# Patient Record
Sex: Male | Born: 1945 | Race: White | Hispanic: No | Smoking: Former smoker
Health system: Southern US, Community
[De-identification: ages and names within clinical notes are randomized; demographics above are authoritative.]

## PROBLEM LIST (undated history)

## (undated) DIAGNOSIS — Q396 Congenital diverticulum of esophagus: Secondary | ICD-10-CM

---

## 2002-08-01 ENCOUNTER — Ambulatory Visit (HOSPITAL_COMMUNITY): Admission: RE | Admit: 2002-08-01 | Discharge: 2002-08-01 | Payer: Self-pay | Admitting: Neurosurgery

## 2002-08-01 ENCOUNTER — Encounter: Payer: Self-pay | Admitting: Neurosurgery

## 2002-08-09 ENCOUNTER — Encounter: Payer: Self-pay | Admitting: Neurosurgery

## 2002-08-09 ENCOUNTER — Encounter: Admission: RE | Admit: 2002-08-09 | Discharge: 2002-08-09 | Payer: Self-pay | Admitting: Neurosurgery

## 2002-09-10 ENCOUNTER — Encounter: Payer: Self-pay | Admitting: Neurosurgery

## 2002-09-13 ENCOUNTER — Inpatient Hospital Stay (HOSPITAL_COMMUNITY): Admission: RE | Admit: 2002-09-13 | Discharge: 2002-09-15 | Payer: Self-pay | Admitting: Neurosurgery

## 2002-09-15 ENCOUNTER — Encounter: Payer: Self-pay | Admitting: Neurosurgery

## 2004-08-28 ENCOUNTER — Ambulatory Visit: Payer: Self-pay

## 2006-07-19 ENCOUNTER — Ambulatory Visit: Payer: Self-pay | Admitting: Gastroenterology

## 2006-08-02 ENCOUNTER — Ambulatory Visit: Payer: Self-pay | Admitting: Gastroenterology

## 2010-12-31 ENCOUNTER — Ambulatory Visit: Payer: Self-pay | Admitting: Pain Medicine

## 2011-01-20 ENCOUNTER — Ambulatory Visit: Payer: Self-pay | Admitting: Pain Medicine

## 2012-01-31 ENCOUNTER — Other Ambulatory Visit: Payer: Self-pay | Admitting: Pain Medicine

## 2012-01-31 ENCOUNTER — Ambulatory Visit: Payer: Self-pay | Admitting: Pain Medicine

## 2012-02-08 ENCOUNTER — Ambulatory Visit: Payer: Self-pay | Admitting: Pain Medicine

## 2012-02-23 ENCOUNTER — Ambulatory Visit: Payer: Self-pay | Admitting: Pain Medicine

## 2012-06-20 ENCOUNTER — Ambulatory Visit: Payer: Self-pay | Admitting: Gastroenterology

## 2014-07-18 ENCOUNTER — Ambulatory Visit: Admit: 2014-07-18 | Disposition: A | Discharge status: Home / Self Care | Payer: Self-pay

## 2016-05-17 ENCOUNTER — Emergency Department
Admission: EM | Admit: 2016-05-17 | Discharge: 2016-05-17 | Disposition: A | Discharge status: Home / Self Care | Payer: Medicare Other | Attending: Emergency Medicine | Admitting: Emergency Medicine

## 2016-05-17 ENCOUNTER — Encounter: Payer: Self-pay | Admitting: *Deleted

## 2016-05-17 DIAGNOSIS — Z87891 Personal history of nicotine dependence: Secondary | ICD-10-CM | POA: Insufficient documentation

## 2016-05-17 DIAGNOSIS — B349 Viral infection, unspecified: Secondary | ICD-10-CM

## 2016-05-17 DIAGNOSIS — J029 Acute pharyngitis, unspecified: Secondary | ICD-10-CM | POA: Diagnosis present

## 2016-05-17 LAB — INFLUENZA PANEL BY PCR (TYPE A & B)
INFLAPCR: NEGATIVE
INFLBPCR: NEGATIVE

## 2016-05-17 LAB — POCT RAPID STREP A: Streptococcus, Group A Screen (Direct): NEGATIVE

## 2016-05-17 MED ORDER — LIDOCAINE VISCOUS 2 % MT SOLN: 5.0000 mL | Freq: Four times a day (QID) | OROMUCOSAL | 0 refills | Status: DC | PRN

## 2016-05-17 MED ORDER — KETOROLAC TROMETHAMINE 30 MG/ML IJ SOLN
30.0000 mg | Freq: Once | INTRAMUSCULAR | Status: AC
  Administered 2016-05-17: 30 mg via INTRAVENOUS
  Filled 2016-05-17: qty 1

## 2016-05-17 MED ORDER — FIRST-DUKES MOUTHWASH MT SUSP: 10.0000 mL | Freq: Four times a day (QID) | OROMUCOSAL | 0 refills | Status: DC

## 2016-05-17 NOTE — Discharge Instructions (Signed)
Advised patient to get flu shot. Advised Tylenol or ibuprofen for body aches and pain/fever.

## 2016-05-17 NOTE — ED Provider Notes (Signed)
[facility] Emergency Department Provider Note   ____________________________________________   None    (approximate)  I have reviewed the triage vital signs and the nursing notes.   HISTORY  Chief Complaint Chills and Sore Throat    HPI Oscar Sanchez is a 71 y.o. male any of fever and chills, sore throat, and body aches for 2 days. No palliative measures taken for these complaints. Patient has not taken the flu shot this season. Patient denies N/V/D. Denies nasal congestion or cough.   No past medical history on file.  There are no active problems to display for this patient.   No past surgical history on file.  Prior to Admission medications   Medication Sig Start Date End Date Taking? Authorizing Provider  tiotropium (SPIRIVA) 18 MCG inhalation capsule Place 18 mcg into inhaler and inhale daily.   Yes Historical Provider, MD  Diphenhyd-Hydrocort-Nystatin (FIRST-DUKES MOUTHWASH) SUSP Use as directed 10 mLs in the mouth or throat 4 (four) times daily. Mixed with 5 mL of viscous lidocaine for swish and swallow 05/17/16   Sable Feil, PA-C  lidocaine (XYLOCAINE) 2 % solution Use as directed 5 mLs in the mouth or throat every 6 (six) hours as needed for mouth pain. Mixed with Duke mouthwash swish and swallow. 05/17/16   Sable Feil, PA-C    Allergies Patient has no allergy information on record.  No family history on file.  Social History Social History  Substance Use Topics  . Smoking status: Former Research scientist (life sciences)  . Smokeless tobacco: Never Used  . Alcohol use No    Review of Systems Constitutional:Fever/chills and body ache. Eyes: No visual changes. ENT: Sore throates chest pain. Respiratory: Denies shortness of breath. Gastrointestinal: No abdominal pain.  No nausea, no vomiting.  No diarrhea.  No constipation. Genitourinary: Negative for dysuria. Musculoskeletal: Negative for back pain. Skin: Negative for rash. Neurological: Negative  for headaches, focal weakness or numbness.   ____________________________________________   PHYSICAL EXAM:  VITAL SIGNS: ED Triage Vitals [05/17/16 1438]  Enc Vitals Group     BP 140/75     Pulse Rate (!) 104     Resp 18     Temp 99 F (37.2 C)     Temp Source Oral     SpO2 94 %     Weight 145 lb (65.8 kg)     Height 5\' 9"  (1.753 m)     Head Circumference      Peak Flow      Pain Score      Pain Loc      Pain Edu?      Excl. in [place]?     Constitutional: Alert and oriented. Well appearing and in no acute distress. Eyes: Conjunctivae are normal. PERRL. EOMI. Head: Atraumatic. Nose: No congestion/rhinnorhea. Mouth/Throat: Mucous membranes are moist.  Oropharynx non-erythematous. Neck: No stridor.  No cervical spine tenderness to palpation. Hematological/Lymphatic/Immunilogical: No cervical lymphadenopathy. Cardiovascular: Normal rate, regular rhythm. Grossly normal heart sounds.  Good peripheral circulation. Respiratory: Normal respiratory effort.  No retractions. Lungs CTAB. Gastrointestinal: Soft and nontender. No distention. No abdominal bruits. No CVA tenderness. Musculoskeletal: No lower extremity tenderness nor edema.  No joint effusions. Neurologic:  Normal speech and language. No gross focal neurologic deficits are appreciated. No gait instability. Skin:  Skin is warm, dry and intact. No rash noted. Psychiatric: Mood and affect are normal. Speech and behavior are normal.  ____________________________________________   LABS (all labs ordered are listed, but only abnormal  results are displayed)  Labs Reviewed  INFLUENZA PANEL BY PCR (TYPE A & B)  POCT RAPID STREP A   ____________________________________________  EKG   ____________________________________________  RADIOLOGY   ____________________________________________   PROCEDURES  Procedure(s) performed: None  Procedures  Critical Care performed:  No  ____________________________________________   INITIAL IMPRESSION / ASSESSMENT AND PLAN / ED COURSE  Pertinent labs & imaging results that were available during my care of the patient were reviewed by me and considered in my medical decision making (see chart for details).  Pharyngitis and viral illness. Discussed negative rapid flu and strep tests. Advised patient cultures pending. Patient given discharge care instructions. Patient given a prescription for Duke mouthwash and viscous lidocaine. Patient advised Tylenol or ibuprofen for body aches. Patient advised to obtain a flu shot. Patient advised follow-up with [facility] clinic. Return by ER for condition worsens.      ____________________________________________   FINAL CLINICAL IMPRESSION(S) / ED DIAGNOSES  Final diagnoses:  Viral pharyngitis  Viral illness      NEW MEDICATIONS STARTED DURING THIS VISIT:  New Prescriptions   DIPHENHYD-HYDROCORT-NYSTATIN (FIRST-DUKES MOUTHWASH) SUSP    Use as directed 10 mLs in the mouth or throat 4 (four) times daily. Mixed with 5 mL of viscous lidocaine for swish and swallow   LIDOCAINE (XYLOCAINE) 2 % SOLUTION    Use as directed 5 mLs in the mouth or throat every 6 (six) hours as needed for mouth pain. Mixed with Duke mouthwash swish and swallow.     Note:  This document was prepared using Dragon voice recognition software and may include unintentional dictation errors.    Sable Feil, PA-C 05/17/16 1653    Hinda Kehr, MD 05/17/16 [phone]

## 2016-05-17 NOTE — ED Triage Notes (Signed)
Pt reports chills and fever beginning Saturday. Pt also verbalized having a sore throat. Denies NVD or cough. Pt alert and oriented in triage

## 2016-08-03 ENCOUNTER — Encounter: Payer: Medicare Other | Attending: Specialist | Admitting: Respiratory Therapy

## 2016-08-03 VITALS — Ht 68.0 in | Wt 148.1 lb

## 2016-08-03 DIAGNOSIS — J449 Chronic obstructive pulmonary disease, unspecified: Secondary | ICD-10-CM | POA: Diagnosis not present

## 2016-08-03 DIAGNOSIS — Z87891 Personal history of nicotine dependence: Secondary | ICD-10-CM | POA: Insufficient documentation

## 2016-08-03 NOTE — Progress Notes (Signed)
Pulmonary Individual Treatment Plan  Patient Details  Name: Oscar Sanchez MRN: [id number] Date of Birth: Jun 01, 1945 Referring Provider:     Pulmonary Rehab from 08/03/2016 in [facility] Cardiac and Pulmonary Rehab  Referring Provider  Raul Del      Initial Encounter Date:    Pulmonary Rehab from 08/03/2016 in [facility] Cardiac and Pulmonary Rehab  Date  08/03/16  Referring Provider  Raul Del      Visit Diagnosis: Chronic obstructive pulmonary disease, unspecified COPD type ([place])  Patient's Home Medications on Admission:  Current Outpatient Prescriptions:    Diphenhyd-Hydrocort-Nystatin (FIRST-DUKES MOUTHWASH) SUSP, Use as directed 10 mLs in the mouth or throat 4 (four) times daily. Mixed with 5 mL of viscous lidocaine for swish and swallow, Disp: 237 mL, Rfl: 0   lidocaine (XYLOCAINE) 2 % solution, Use as directed 5 mLs in the mouth or throat every 6 (six) hours as needed for mouth pain. Mixed with Duke mouthwash swish and swallow., Disp: 100 mL, Rfl: 0   tiotropium (SPIRIVA) 18 MCG inhalation capsule, Place 18 mcg into inhaler and inhale daily., Disp: , Rfl:   Past Medical History: No past medical history on file.  Tobacco Use: History  Smoking Status   Former Smoker  Smokeless Tobacco   Never Used    Labs: Recent Review Flowsheet Data    There is no flowsheet data to display.       ADL UCSD:     Pulmonary Assessment Scores    Row Name 08/03/16 1132         ADL UCSD   ADL Phase Entry     SOB Score total 13     Rest 0     Walk 0     Stairs 3     Bath 0     Dress 0     Shop 0        Pulmonary Function Assessment:     Pulmonary Function Assessment - 08/03/16 1131      Pulmonary Function Tests   RV% 50 %   DLCO% 70 %     Initial Spirometry Results   FVC% 80 %   FEV1% 64 %   FEV1/FVC Ratio 63   Comments test date 11/04/15     Post Bronchodilator Spirometry Results   FVC% 89 %   FEV1% 67 %   FEV1/FVC Ratio 60     Breath   Bilateral Breath Sounds  Clear   Shortness of Breath Yes      Exercise Target Goals: Date: 08/03/16  Exercise Program Goal: Individual exercise prescription set with THRR, safety & activity barriers. Participant demonstrates ability to understand and report RPE using BORG scale, to self-measure pulse accurately, and to acknowledge the importance of the exercise prescription.  Exercise Prescription Goal: Starting with aerobic activity 30 plus minutes a day, 3 days per week for initial exercise prescription. Provide home exercise prescription and guidelines that participant acknowledges understanding prior to discharge.  Activity Barriers & Risk Stratification:   6 Minute Walk:     6 Minute Walk    Row Name 08/03/16 1127         6 Minute Walk   Distance 1600 feet     Walk Time 6 minutes     # of Rest Breaks 0     MPH 3.03     METS 3.89     RPE 13     Perceived Dyspnea  2     VO2 Peak 13.6  Symptoms No     Resting HR 67 bpm     Resting BP 120/70     Max Ex. HR 112 bpm     Max Ex. BP 142/62       Interval HR   3 Minute HR 105     6 Minute HR 112     Interval Heart Rate? Yes       Interval Oxygen   Interval Oxygen? Yes     Baseline Oxygen Saturation % 96 %     3 Minute Oxygen Saturation % 91 %     6 Minute Oxygen Saturation % 89 %       Oxygen Initial Assessment:     Oxygen Initial Assessment - 08/03/16 1136      Home Oxygen   Home Oxygen Device None      Oxygen Re-Evaluation:   Oxygen Discharge (Final Oxygen Re-Evaluation):   Initial Exercise Prescription:     Initial Exercise Prescription - 08/03/16 1100      Date of Initial Exercise RX and Referring Provider   Date 08/03/16   Referring Provider Raul Del     Treadmill   MPH 2.8   Grade 1   Minutes 15   METs 3.53     Recumbant Bike   Level 3   RPM 60   Watts 35   Minutes 15   METs 3.5     Elliptical   Level 1   Speed 3   Minutes 15     Prescription Details   Frequency (times per week) 3   Duration  Progress to 45 minutes of aerobic exercise without signs/symptoms of physical distress     Intensity   THRR 40-80% of Max Heartrate 99-132   Ratings of Perceived Exertion 11-15     Resistance Training   Training Prescription Yes   Weight 3   Reps 10-15      Perform Capillary Blood Glucose checks as needed.  Exercise Prescription Changes:   Exercise Comments:   Exercise Goals and Review:     Exercise Goals    Row Name 08/03/16 1133             Exercise Goals   Increase Physical Activity Yes       Intervention Provide advice, education, support and counseling about physical activity/exercise needs.;Develop an individualized exercise prescription for aerobic and resistive training based on initial evaluation findings, risk stratification, comorbidities and participant's personal goals.       Expected Outcomes Achievement of increased cardiorespiratory fitness and enhanced flexibility, muscular endurance and strength shown through measurements of functional capacity and personal statement of participant.       Increase Strength and Stamina Yes       Intervention Provide advice, education, support and counseling about physical activity/exercise needs.;Develop an individualized exercise prescription for aerobic and resistive training based on initial evaluation findings, risk stratification, comorbidities and participant's personal goals.       Expected Outcomes Achievement of increased cardiorespiratory fitness and enhanced flexibility, muscular endurance and strength shown through measurements of functional capacity and personal statement of participant.          Exercise Goals Re-Evaluation :   Discharge Exercise Prescription (Final Exercise Prescription Changes):   Nutrition:  Target Goals: Understanding of nutrition guidelines, daily intake of sodium 1500mg , cholesterol 200mg , calories 30% from fat and 7% or less from saturated fats, daily to have 5 or more servings  of fruits and vegetables.  Biometrics:  Pre Biometrics - 08/03/16 1126      Pre Biometrics   Height 5\' 8"  (1.727 m)   Weight 148 lb 1.6 oz (67.2 kg)   Waist Circumference 38 inches   Hip Circumference 40.75 inches   Waist to Hip Ratio 0.93 %   BMI (Calculated) 22.6       Nutrition Therapy Plan and Nutrition Goals:   Nutrition Discharge: Rate Your Plate Scores:   Nutrition Goals Re-Evaluation:   Nutrition Goals Discharge (Final Nutrition Goals Re-Evaluation):   Psychosocial: Target Goals: Acknowledge presence or absence of significant depression and/or stress, maximize coping skills, provide positive support system. Participant is able to verbalize types and ability to use techniques and skills needed for reducing stress and depression.   Initial Review & Psychosocial Screening:     Initial Psych Review & Screening - 08/03/16 Oscar Sanchez? Yes   Comments Oscar Sanchez has good support from his church family. He is divorced and no children. 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 At age 87, he found God and has been very blessed. 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 He is looking forward to [place] for improve his shortness of breath.and increased stamina.     Barriers   Psychosocial barriers to participate in program The patient should benefit from training in stress management and relaxation.     Screening Interventions   Interventions Encouraged to exercise      Quality of Life Scores:     Quality of Life - 08/03/16 1142      Quality of Life Scores   Health/Function Pre 20.67 %   Socioeconomic Pre 20.71 %   Psych/Spiritual Pre 21 %   Family Pre 20.33 %   GLOBAL Pre 20.72 %      PHQ-9: Recent Review Flowsheet Data    Depression screen [facility] 2/9 08/03/2016   Decreased Interest 0   Down, Depressed, Hopeless 0   PHQ - 2 Score 0   Altered  sleeping 3   Tired, decreased energy 1   Change in appetite 0   Feeling bad or failure about yourself  0   Trouble concentrating 0   Moving slowly or fidgety/restless 0   Suicidal thoughts 0   PHQ-9 Score 4   Difficult doing work/chores Not difficult at all     Interpretation of Total Score  Total Score Depression Severity:  1-4 = Minimal depression, 5-9 = Mild depression, 10-14 = Moderate depression, 15-19 = Moderately severe depression, 20-27 = Severe depression   Psychosocial Evaluation and Intervention:   Psychosocial Re-Evaluation:   Psychosocial Discharge (Final Psychosocial Re-Evaluation):   Education: Education Goals: Education classes will be provided on a weekly basis, covering required topics. Participant will state understanding/return demonstration of topics presented.  Learning Barriers/Preferences:     Learning Barriers/Preferences - 08/03/16 1131      Learning Barriers/Preferences   Learning Barriers None   Learning Preferences None      Education Topics: Initial Evaluation Education: - Verbal, written and demonstration of respiratory meds, RPE/PD scales, oximetry and breathing techniques. Instruction on use of nebulizers and MDIs: cleaning and proper use, rinsing mouth with steroid doses and importance of monitoring MDI activations.   Pulmonary Rehab from 08/03/2016 in [facility] Cardiac and Pulmonary Rehab  Date  08/03/16  Educator  LB  Instruction Review Code  2-  meets goals/outcomes      General Nutrition Guidelines/Fats and Fiber: -Group instruction provided by verbal, written material, models and posters to present the general guidelines for heart healthy nutrition. Gives an explanation and review of dietary fats and fiber.   Controlling Sodium/Reading Food Labels: -Group verbal and written material supporting the discussion of sodium use in heart healthy nutrition. Review and explanation with models, verbal and written materials for utilization of  the food label.   Exercise Physiology & Risk Factors: - Group verbal and written instruction with models to review the exercise physiology of the cardiovascular system and associated critical values. Details cardiovascular disease risk factors and the goals associated with each risk factor.   Aerobic Exercise & Resistance Training: - Gives group verbal and written discussion on the health impact of inactivity. On the components of aerobic and resistive training programs and the benefits of this training and how to safely progress through these programs.   Flexibility, Balance, General Exercise Guidelines: - Provides group verbal and written instruction on the benefits of flexibility and balance training programs. Provides general exercise guidelines with specific guidelines to those with heart or lung disease. Demonstration and skill practice provided.   Stress Management: - Provides group verbal and written instruction about the health risks of elevated stress, cause of high stress, and healthy ways to reduce stress.   Depression: - Provides group verbal and written instruction on the correlation between heart/lung disease and depressed mood, treatment options, and the stigmas associated with seeking treatment.   Exercise & Equipment Safety: - Individual verbal instruction and demonstration of equipment use and safety with use of the equipment.   Infection Prevention: - Provides verbal and written material to individual with discussion of infection control including proper hand washing and proper equipment cleaning during exercise session.   Pulmonary Rehab from 08/03/2016 in [facility] Cardiac and Pulmonary Rehab  Date  08/03/16  Educator  LB  Instruction Review Code  2- meets goals/outcomes      Falls Prevention: - Provides verbal and written material to individual with discussion of falls prevention and safety.   Pulmonary Rehab from 08/03/2016 in [facility] Cardiac and Pulmonary Rehab   Date  08/03/16  Educator  LB  Instruction Review Code  2- meets goals/outcomes      Diabetes: - Individual verbal and written instruction to review signs/symptoms of diabetes, desired ranges of glucose level fasting, after meals and with exercise. Advice that pre and post exercise glucose checks will be done for 3 sessions at entry of program.   Chronic Lung Diseases: - Group verbal and written instruction to review new updates, new respiratory medications, new advancements in procedures and treatments. Provide informative websites and "800" numbers of self-education.   Lung Procedures: - Group verbal and written instruction to describe testing methods done to diagnose lung disease. Review the outcome of test results. Describe the treatment choices: Pulmonary Function Tests, ABGs and oximetry.   Energy Conservation: - Provide group verbal and written instruction for methods to conserve energy, plan and organize activities. Instruct on pacing techniques, use of adaptive equipment and posture/positioning to relieve shortness of breath.   Triggers: - Group verbal and written instruction to review types of environmental controls: home humidity, furnaces, filters, dust mite/pet prevention, HEPA vacuums. To discuss weather changes, air quality and the benefits of nasal washing.   Exacerbations: - Group verbal and written instruction to provide: warning signs, infection symptoms, calling MD promptly, preventive modes, and value of vaccinations. Review: effective airway  clearance, coughing and/or vibration techniques. Create an Sports administrator.   Oxygen: - Individual and group verbal and written instruction on oxygen therapy. Includes supplement oxygen, available portable oxygen systems, continuous and intermittent flow rates, oxygen safety, concentrators, and Medicare reimbursement for oxygen.   Respiratory Medications: - Group verbal and written instruction to review medications for lung  disease. Drug class, frequency, complications, importance of spacers, rinsing mouth after steroid MDI's, and proper cleaning methods for nebulizers.   Pulmonary Rehab from 08/03/2016 in [facility] Cardiac and Pulmonary Rehab  Date  08/03/16  Educator  LB  Instruction Review Code  2- meets goals/outcomes      AED/CPR: - Group verbal and written instruction with the use of models to demonstrate the basic use of the AED with the basic ABC's of resuscitation.   Breathing Retraining: - Provides individuals verbal and written instruction on purpose, frequency, and proper technique of diaphragmatic breathing and pursed-lipped breathing. Applies individual practice skills.   Pulmonary Rehab from 08/03/2016 in [facility] Cardiac and Pulmonary Rehab  Date  08/03/16  Educator  LB  Instruction Review Code  2- meets goals/outcomes      Anatomy and Physiology of the Lungs: - Group verbal and written instruction with the use of models to provide basic lung anatomy and physiology related to function, structure and complications of lung disease.   Heart Failure: - Group verbal and written instruction on the basics of heart failure: signs/symptoms, treatments, explanation of ejection fraction, enlarged heart and cardiomyopathy.   Sleep Apnea: - Individual verbal and written instruction to review Obstructive Sleep Apnea. Review of risk factors, methods for diagnosing and types of masks and machines for OSA.   Anxiety: - Provides group, verbal and written instruction on the correlation between heart/lung disease and anxiety, treatment options, and management of anxiety.   Relaxation: - Provides group, verbal and written instruction about the benefits of relaxation for patients with heart/lung disease. Also provides patients with examples of relaxation techniques.   Knowledge Questionnaire Score:     Knowledge Questionnaire Score - 08/03/16 1131      Knowledge Questionnaire Score   Pre Score 6/10        Core Components/Risk Factors/Patient Goals at Admission:     Personal Goals and Risk Factors at Admission - 08/03/16 1135      Core Components/Risk Factors/Patient Goals on Admission   Improve shortness of breath with ADL's Yes   Intervention Provide education, individualized exercise plan and daily activity instruction to help decrease symptoms of SOB with activities of daily living.   Expected Outcomes Short Term: Achieves a reduction of symptoms when performing activities of daily living.   Develop more efficient breathing techniques such as purse lipped breathing and diaphragmatic breathing; and practicing self-pacing with activity Yes   Intervention Provide education, demonstration and support about specific breathing techniuqes utilized for more efficient breathing. Include techniques such as pursed lipped breathing, diaphragmatic breathing and self-pacing activity.   Expected Outcomes Short Term: Participant will be able to demonstrate and use breathing techniques as needed throughout daily activities.   Increase knowledge of respiratory medications and ability to use respiratory devices properly  Yes  Spiriva, rarely uses Proventil   Intervention Provide education and demonstration as needed of appropriate use of medications, inhalers, and oxygen therapy.   Expected Outcomes Short Term: Achieves understanding of medications use. Understands that oxygen is a medication prescribed by physician. Demonstrates appropriate use of inhaler and oxygen therapy.      Core Components/Risk Factors/Patient Goals  Review:    Core Components/Risk Factors/Patient Goals at Discharge (Final Review):    ITP Comments:     ITP Comments    Row Name 08/03/16 1135           ITP Comments Face to face meeting with Dr Emily Filbert, [place] Director today.          Comments: Oscar Sanchez plans to start [place] on 08/09/16 and attend 3 days/week.

## 2016-08-03 NOTE — Patient Instructions (Signed)
Patient Instructions  Patient Details  Name: Oscar Sanchez MRN: [id number] Date of Birth: 02/21/1946 Referring Provider:  Erby Pian, MD  Below are the personal goals you chose as well as exercise and nutrition goals. Our goal is to help you keep on track towards obtaining and maintaining your goals. We will be discussing your progress on these goals with you throughout the program.  Initial Exercise Prescription:     Initial Exercise Prescription - 08/03/16 1100      Date of Initial Exercise RX and Referring Provider   Date 08/03/16   Referring Provider Raul Del     Treadmill   MPH 2.8   Grade 1   Minutes 15   METs 3.53     Recumbant Bike   Level 3   RPM 60   Watts 35   Minutes 15   METs 3.5     Elliptical   Level 1   Speed 3   Minutes 15     Prescription Details   Frequency (times per week) 3   Duration Progress to 45 minutes of aerobic exercise without signs/symptoms of physical distress     Intensity   THRR 40-80% of Max Heartrate 99-132   Ratings of Perceived Exertion 11-15     Resistance Training   Training Prescription Yes   Weight 3   Reps 10-15      Exercise Goals: Frequency: Be able to perform aerobic exercise three times per week working toward 3-5 days per week.  Intensity: Work with a perceived exertion of 11 (fairly light) - 15 (hard) as tolerated. Follow your new exercise prescription and watch for changes in prescription as you progress with the program. Changes will be reviewed with you when they are made.  Duration: You should be able to do 30 minutes of continuous aerobic exercise in addition to a 5 minute warm-up and a 5 minute cool-down routine.  Nutrition Goals: Your personal nutrition goals will be established when you do your nutrition analysis with the dietician.  The following are nutrition guidelines to follow: Cholesterol < 200mg /day Sodium < 1500mg /day Fiber: Men over 50 yrs - 30 grams per day  Personal Goals:      Personal Goals and Risk Factors at Admission - 08/03/16 1135      Core Components/Risk Factors/Patient Goals on Admission   Improve shortness of breath with ADL's Yes   Intervention Provide education, individualized exercise plan and daily activity instruction to help decrease symptoms of SOB with activities of daily living.   Expected Outcomes Short Term: Achieves a reduction of symptoms when performing activities of daily living.   Develop more efficient breathing techniques such as purse lipped breathing and diaphragmatic breathing; and practicing self-pacing with activity Yes   Intervention Provide education, demonstration and support about specific breathing techniuqes utilized for more efficient breathing. Include techniques such as pursed lipped breathing, diaphragmatic breathing and self-pacing activity.   Expected Outcomes Short Term: Participant will be able to demonstrate and use breathing techniques as needed throughout daily activities.   Increase knowledge of respiratory medications and ability to use respiratory devices properly  Yes  Spiriva, rarely uses Proventil   Intervention Provide education and demonstration as needed of appropriate use of medications, inhalers, and oxygen therapy.   Expected Outcomes Short Term: Achieves understanding of medications use. Understands that oxygen is a medication prescribed by physician. Demonstrates appropriate use of inhaler and oxygen therapy.      Tobacco Use Initial Evaluation: History  Smoking Status  Former Smoker  Smokeless Tobacco   Never Used    Copy of goals given to participant.

## 2016-08-09 ENCOUNTER — Encounter: Payer: Medicare Other | Admitting: Respiratory Therapy

## 2016-08-09 DIAGNOSIS — J449 Chronic obstructive pulmonary disease, unspecified: Secondary | ICD-10-CM | POA: Diagnosis not present

## 2016-08-09 NOTE — Progress Notes (Signed)
Daily Session Note  Patient Details  Name: Torri Langston MRN: [id number] Date of Birth: [birth year] Referring Provider:     Pulmonary Rehab from 08/03/2016 in [facility]  Referring Provider  Raul Del      Encounter Date: 08/09/2016  Check In:     Session Check In - 08/09/16 1348      Check-In   Location [facility]-Cardiac & Pulmonary [facility]hab   Staff Present Nyoka Cowden, RN, BSN, [place], BS, RRT, Respiratory Therapist;Kelly Amedeo Plenty, BS, ACSM CEP, Exercise Physiologist   Supervising physician immediately available to respond to emergencies [facility] immediately available ER MD   Physician(s) Burlene Arnt and Jimmye Norman   Medication changes reported     No   Fall or balance concerns reported    No   Warm-up and Cool-down Performed on first and last piece of equipment   Resistance Training Performed Yes   VAD Patient? No     Pain Assessment   Currently in Pain? No/denies   Multiple Pain Sites No         History  Smoking Status   Former Smoker  Smokeless Tobacco   Never Used    Goals Met:  Proper associated with RPD/PD & O2 Sat Exercise tolerated well Personal goals reviewed Queuing for purse lip breathing No report of cardiac concerns or symptoms Strength training completed today  Goals Unmet:  Not Applicable  Comments: First full day of exercise!  Patient was oriented to gym and equipment including functions, settings, policies, and procedures.  Patient's individual exercise prescription and treatment plan were reviewed.  All starting workloads were established based on the results of the 6 minute walk test done at initial orientation visit.  The plan for exercise progression was also introduced and progression will be customized based on patient's performance and goals.   Dr. Emily Filbert is Medical Director for [facility] and [facility].

## 2016-08-09 NOTE — Progress Notes (Signed)
Pulmonary Individual Treatment Plan  Patient Details  Name: Oscar Sanchez MRN: [id number] Date of Birth: 1945/05/15 Referring Provider:     Pulmonary Rehab from 08/03/2016 in [facility] Cardiac and Pulmonary Rehab  Referring Provider  Raul Del      Initial Encounter Date:    Pulmonary Rehab from 08/03/2016 in [facility] Cardiac and Pulmonary Rehab  Date  08/03/16  Referring Provider  Raul Del      Visit Diagnosis: Chronic obstructive pulmonary disease, unspecified COPD type ([place])  Patient's Home Medications on Admission:  Current Outpatient Prescriptions:    Diphenhyd-Hydrocort-Nystatin (FIRST-DUKES MOUTHWASH) SUSP, Use as directed 10 mLs in the mouth or throat 4 (four) times daily. Mixed with 5 mL of viscous lidocaine for swish and swallow, Disp: 237 mL, Rfl: 0   lidocaine (XYLOCAINE) 2 % solution, Use as directed 5 mLs in the mouth or throat every 6 (six) hours as needed for mouth pain. Mixed with [facility] mouthwash swish and swallow., Disp: 100 mL, Rfl: 0   tiotropium (SPIRIVA) 18 MCG inhalation capsule, Place 18 mcg into inhaler and inhale daily., Disp: , Rfl:   Past Medical History: No past medical history on file.  Tobacco Use: History  Smoking Status   Former Smoker  Smokeless Tobacco   Never Used    Labs: Recent Review Flowsheet Data    There is no flowsheet data to display.       ADL UCSD:     Pulmonary Assessment Scores    Row Name 08/03/16 1132         ADL UCSD   ADL Phase Entry     SOB Score total 13     Rest 0     Walk 0     Stairs 3     Bath 0     Dress 0     Shop 0        Pulmonary Function Assessment:     Pulmonary Function Assessment - 08/03/16 1131      Pulmonary Function Tests   RV% 50 %   DLCO% 70 %     Initial Spirometry Results   FVC% 80 %   FEV1% 64 %   FEV1/FVC Ratio 63   Comments test date 11/04/15     Post Bronchodilator Spirometry Results   FVC% 89 %   FEV1% 67 %   FEV1/FVC Ratio 60     Breath   Bilateral Breath Sounds  Clear   Shortness of Breath Yes      Exercise Target Goals:    Exercise Program Goal: Individual exercise prescription set with THRR, safety & activity barriers. Participant demonstrates ability to understand and report RPE using BORG scale, to self-measure pulse accurately, and to acknowledge the importance of the exercise prescription.  Exercise Prescription Goal: Starting with aerobic activity 30 plus minutes a day, 3 days per week for initial exercise prescription. Provide home exercise prescription and guidelines that participant acknowledges understanding prior to discharge.  Activity Barriers & Risk Stratification:   6 Minute Walk:     6 Minute Walk    Row Name 08/03/16 1127         6 Minute Walk   Distance 1600 feet     Walk Time 6 minutes     # of Rest Breaks 0     MPH 3.03     METS 3.89     RPE 13     Perceived Dyspnea  2     VO2 Peak 13.6  Symptoms No     Resting HR 67 bpm     Resting BP 120/70     Max Ex. HR 112 bpm     Max Ex. BP 142/62       Interval HR   3 Minute HR 105     6 Minute HR 112     Interval Heart Rate? Yes       Interval Oxygen   Interval Oxygen? Yes     Baseline Oxygen Saturation % 96 %     3 Minute Oxygen Saturation % 91 %     6 Minute Oxygen Saturation % 89 %       Oxygen Initial Assessment:     Oxygen Initial Assessment - 08/03/16 1136      Home Oxygen   Home Oxygen Device None      Oxygen Re-Evaluation:   Oxygen Discharge (Final Oxygen Re-Evaluation):   Initial Exercise Prescription:     Initial Exercise Prescription - 08/03/16 1100      Date of Initial Exercise RX and Referring Provider   Date 08/03/16   Referring Provider Raul Del     Treadmill   MPH 2.8   Grade 1   Minutes 15   METs 3.53     Recumbant Bike   Level 3   RPM 60   Watts 35   Minutes 15   METs 3.5     Elliptical   Level 1   Speed 3   Minutes 15     Prescription Details   Frequency (times per week) 3   Duration Progress to  45 minutes of aerobic exercise without signs/symptoms of physical distress     Intensity   THRR 40-80% of Max Heartrate 99-132   Ratings of Perceived Exertion 11-15     Resistance Training   Training Prescription Yes   Weight 3   Reps 10-15      Perform Capillary Blood Glucose checks as needed.  Exercise Prescription Changes:     Exercise Prescription Changes    Row Name 08/09/16 1300             Response to Exercise   Blood Pressure (Admit) 110/62       Blood Pressure (Exercise) 150/66       Blood Pressure (Exit) 110/68       Heart Rate (Admit) 85 bpm       Heart Rate (Exercise) 133 bpm       Heart Rate (Exit) 102 bpm       Oxygen Saturation (Admit) 97 %       Oxygen Saturation (Exercise) 92 %       Oxygen Saturation (Exit) 92 %       Rating of Perceived Exertion (Exercise) 13       Perceived Dyspnea (Exercise) 2       Duration Progress to 45 minutes of aerobic exercise without signs/symptoms of physical distress       Intensity THRR unchanged         Progression   Progression Continue to progress workloads to maintain intensity without signs/symptoms of physical distress.         Resistance Training   Training Prescription Yes       Weight 3       Reps 10-15         Treadmill   MPH 2.8       Grade 1       Minutes 15  Recumbant Bike   Level 3       RPM 80       Minutes 10         Elliptical   Level 1       Minutes 5          Exercise Comments:   Exercise Goals and Review:     Exercise Goals    Row Name 08/03/16 1133             Exercise Goals   Increase Physical Activity Yes       Intervention Provide advice, education, support and counseling about physical activity/exercise needs.;Develop an individualized exercise prescription for aerobic and resistive training based on initial evaluation findings, risk stratification, comorbidities and participant's personal goals.       Expected Outcomes Achievement of increased  cardiorespiratory fitness and enhanced flexibility, muscular endurance and strength shown through measurements of functional capacity and personal statement of participant.       Increase Strength and Stamina Yes       Intervention Provide advice, education, support and counseling about physical activity/exercise needs.;Develop an individualized exercise prescription for aerobic and resistive training based on initial evaluation findings, risk stratification, comorbidities and participant's personal goals.       Expected Outcomes Achievement of increased cardiorespiratory fitness and enhanced flexibility, muscular endurance and strength shown through measurements of functional capacity and personal statement of participant.          Exercise Goals Re-Evaluation :   Discharge Exercise Prescription (Final Exercise Prescription Changes):     Exercise Prescription Changes - 08/09/16 1300      Response to Exercise   Blood Pressure (Admit) 110/62   Blood Pressure (Exercise) 150/66   Blood Pressure (Exit) 110/68   Heart Rate (Admit) 85 bpm   Heart Rate (Exercise) 133 bpm   Heart Rate (Exit) 102 bpm   Oxygen Saturation (Admit) 97 %   Oxygen Saturation (Exercise) 92 %   Oxygen Saturation (Exit) 92 %   Rating of Perceived Exertion (Exercise) 13   Perceived Dyspnea (Exercise) 2   Duration Progress to 45 minutes of aerobic exercise without signs/symptoms of physical distress   Intensity THRR unchanged     Progression   Progression Continue to progress workloads to maintain intensity without signs/symptoms of physical distress.     Resistance Training   Training Prescription Yes   Weight 3   Reps 10-15     Treadmill   MPH 2.8   Grade 1   Minutes 15     Recumbant Bike   Level 3   RPM 80   Minutes 10     Elliptical   Level 1   Minutes 5      Nutrition:  Target Goals: Understanding of nutrition guidelines, daily intake of sodium '1500mg'$ , cholesterol '200mg'$ , calories 30% from  fat and 7% or less from saturated fats, daily to have 5 or more servings of fruits and vegetables.  Biometrics:     Pre Biometrics - 08/03/16 1126      Pre Biometrics   Height '5\' 8"'$  (1.727 m)   Weight 148 lb 1.6 oz (67.2 kg)   Waist Circumference 38 inches   Hip Circumference 40.75 inches   Waist to Hip Ratio 0.93 %   BMI (Calculated) 22.6       Nutrition Therapy Plan and Nutrition Goals:   Nutrition Discharge: Rate Your Plate Scores:   Nutrition Goals Re-Evaluation:   Nutrition Goals Discharge (Final Nutrition Goals Re-Evaluation):  Psychosocial: Target Goals: Acknowledge presence or absence of significant depression and/or stress, maximize coping skills, provide positive support system. Participant is able to verbalize types and ability to use techniques and skills needed for reducing stress and depression.   Initial Review & Psychosocial Screening:     Initial Psych Review & Screening - 08/03/16 [place]? Yes   Comments Mr Manson has good support from his church family. He is divorced and no children. 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 At age 41, he found God and has been very blessed. 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 He is looking forward to [place] for improve his shortness of breath.and increased stamina.     Barriers   Psychosocial barriers to participate in program The patient should benefit from training in stress management and relaxation.     Screening Interventions   Interventions Encouraged to exercise      Quality of Life Scores:     Quality of Life - 08/03/16 1142      Quality of Life Scores   Health/Function Pre 20.67 %   Socioeconomic Pre 20.71 %   Psych/Spiritual Pre 21 %   Family Pre 20.33 %   GLOBAL Pre 20.72 %      PHQ-9: Recent Review Flowsheet Data    Depression screen [facility] 2/9 08/03/2016   Decreased  Interest 0   Down, Depressed, Hopeless 0   PHQ - 2 Score 0   Altered sleeping 3   Tired, decreased energy 1   Change in appetite 0   Feeling bad or failure about yourself  0   Trouble concentrating 0   Moving slowly or fidgety/restless 0   Suicidal thoughts 0   PHQ-9 Score 4   Difficult doing work/chores Not difficult at all     Interpretation of Total Score  Total Score Depression Severity:  1-4 = Minimal depression, 5-9 = Mild depression, 10-14 = Moderate depression, 15-19 = Moderately severe depression, 20-27 = Severe depression   Psychosocial Evaluation and Intervention:     Psychosocial Evaluation - 08/09/16 1242      Psychosocial Evaluation & Interventions   Interventions Encouraged to exercise with the program and follow exercise prescription   Comments Counselor met with mr. Reymundo Poll Konrad Dolores) today for initial psychosocial evaluation.  He is a 71 year old who has been diagnosed with COPD.  He lives alone but has several sisters who live locally and he is actively involved in his local church.  Tommy reports he does not sleep well but he admits to "drinking a lot of coffee."  Counselor encouraged him to switch to decaf after noon and he agreed to try this.  He denies a history of depression or anxiety or current symptoms.  He states he is typically in a positive mood and he has minimal stress in his life - and relies on his faith for any stress that arises.  He has goals to increase his education about his disease and ways to improve his health.  He desires to increase his stamina and strength while in this program.  Staff will be following with him throughout the course of this program.    Expected Outcomes Tommy will exercise consistently to achieve his stated goals.  He will benefit from the educational components as well.     Continue Psychosocial Services  Follow up required by staff      Psychosocial Re-Evaluation:   Psychosocial Discharge (Final Psychosocial  Re-Evaluation):   Education: Education Goals: Education classes will be provided on a weekly basis, covering required topics. Participant will state understanding/return demonstration of topics presented.  Learning Barriers/Preferences:     Learning Barriers/Preferences - 08/03/16 1131      Learning Barriers/Preferences   Learning Barriers None   Learning Preferences None      Education Topics: Initial Evaluation Education: - Verbal, written and demonstration of respiratory meds, RPE/PD scales, oximetry and breathing techniques. Instruction on use of nebulizers and MDIs: cleaning and proper use, rinsing mouth with steroid doses and importance of monitoring MDI activations.   Pulmonary Rehab from 08/09/2016 in [facility] Cardiac and Pulmonary Rehab  Date  08/03/16  Educator  LB  Instruction Review Code  2- meets goals/outcomes      General Nutrition Guidelines/Fats and Fiber: -Group instruction provided by verbal, written material, models and posters to present the general guidelines for heart healthy nutrition. Gives an explanation and review of dietary fats and fiber.   Controlling Sodium/Reading Food Labels: -Group verbal and written material supporting the discussion of sodium use in heart healthy nutrition. Review and explanation with models, verbal and written materials for utilization of the food label.   Pulmonary Rehab from 08/09/2016 in [facility] Cardiac and Pulmonary Rehab  Date  08/09/16  Educator  CR  Instruction Review Code  2- meets goals/outcomes      Exercise Physiology & Risk Factors: - Group verbal and written instruction with models to review the exercise physiology of the cardiovascular system and associated critical values. Details cardiovascular disease risk factors and the goals associated with each risk factor.   Aerobic Exercise & Resistance Training: - Gives group verbal and written discussion on the health impact of inactivity. On the components of aerobic and  resistive training programs and the benefits of this training and how to safely progress through these programs.   Flexibility, Balance, General Exercise Guidelines: - Provides group verbal and written instruction on the benefits of flexibility and balance training programs. Provides general exercise guidelines with specific guidelines to those with heart or lung disease. Demonstration and skill practice provided.   Stress Management: - Provides group verbal and written instruction about the health risks of elevated stress, cause of high stress, and healthy ways to reduce stress.   Depression: - Provides group verbal and written instruction on the correlation between heart/lung disease and depressed mood, treatment options, and the stigmas associated with seeking treatment.   Exercise & Equipment Safety: - Individual verbal instruction and demonstration of equipment use and safety with use of the equipment.   Pulmonary Rehab from 08/09/2016 in [facility] Cardiac and Pulmonary Rehab  Date  08/09/16  Educator  LB  Instruction Review Code  2- meets goals/outcomes      Infection Prevention: - Provides verbal and written material to individual with discussion of infection control including proper hand washing and proper equipment cleaning during exercise session.   Pulmonary Rehab from 08/09/2016 in [facility] Cardiac and Pulmonary Rehab  Date  08/03/16  Educator  LB  Instruction Review Code  2- meets goals/outcomes      Falls Prevention: - Provides verbal and written material to individual with discussion of falls prevention and safety.   Pulmonary Rehab from 08/09/2016 in [facility] Cardiac and Pulmonary Rehab  Date  08/03/16  Educator  LB  Instruction Review Code  2- meets goals/outcomes  Diabetes: - Individual verbal and written instruction to review signs/symptoms of diabetes, desired ranges of glucose level fasting, after meals and with exercise. Advice that pre and post exercise glucose  checks will be done for 3 sessions at entry of program.   Chronic Lung Diseases: - Group verbal and written instruction to review new updates, new respiratory medications, new advancements in procedures and treatments. Provide informative websites and "800" numbers of self-education.   Lung Procedures: - Group verbal and written instruction to describe testing methods done to diagnose lung disease. Review the outcome of test results. Describe the treatment choices: Pulmonary Function Tests, ABGs and oximetry.   Energy Conservation: - Provide group verbal and written instruction for methods to conserve energy, plan and organize activities. Instruct on pacing techniques, use of adaptive equipment and posture/positioning to relieve shortness of breath.   Triggers: - Group verbal and written instruction to review types of environmental controls: home humidity, furnaces, filters, dust mite/pet prevention, HEPA vacuums. To discuss weather changes, air quality and the benefits of nasal washing.   Exacerbations: - Group verbal and written instruction to provide: warning signs, infection symptoms, calling MD promptly, preventive modes, and value of vaccinations. Review: effective airway clearance, coughing and/or vibration techniques. Create an Sports administrator.   Oxygen: - Individual and group verbal and written instruction on oxygen therapy. Includes supplement oxygen, available portable oxygen systems, continuous and intermittent flow rates, oxygen safety, concentrators, and Medicare reimbursement for oxygen.   Respiratory Medications: - Group verbal and written instruction to review medications for lung disease. Drug class, frequency, complications, importance of spacers, rinsing mouth after steroid MDI's, and proper cleaning methods for nebulizers.   Pulmonary Rehab from 08/09/2016 in [facility] Cardiac and Pulmonary Rehab  Date  08/03/16  Educator  LB  Instruction Review Code  2- meets goals/outcomes       AED/CPR: - Group verbal and written instruction with the use of models to demonstrate the basic use of the AED with the basic ABC's of resuscitation.   Breathing Retraining: - Provides individuals verbal and written instruction on purpose, frequency, and proper technique of diaphragmatic breathing and pursed-lipped breathing. Applies individual practice skills.   Pulmonary Rehab from 08/09/2016 in [facility] Cardiac and Pulmonary Rehab  Date  08/03/16  Educator  LB  Instruction Review Code  2- meets goals/outcomes      Anatomy and Physiology of the Lungs: - Group verbal and written instruction with the use of models to provide basic lung anatomy and physiology related to function, structure and complications of lung disease.   Heart Failure: - Group verbal and written instruction on the basics of heart failure: signs/symptoms, treatments, explanation of ejection fraction, enlarged heart and cardiomyopathy.   Sleep Apnea: - Individual verbal and written instruction to review Obstructive Sleep Apnea. Review of risk factors, methods for diagnosing and types of masks and machines for OSA.   Anxiety: - Provides group, verbal and written instruction on the correlation between heart/lung disease and anxiety, treatment options, and management of anxiety.   Relaxation: - Provides group, verbal and written instruction about the benefits of relaxation for patients with heart/lung disease. Also provides patients with examples of relaxation techniques.   Knowledge Questionnaire Score:     Knowledge Questionnaire Score - 08/03/16 1131      Knowledge Questionnaire Score   Pre Score 6/10       Core Components/Risk Factors/Patient Goals at Admission:     Personal Goals and Risk Factors at Admission - 08/03/16 1135  Core Components/Risk Factors/Patient Goals on Admission   Improve shortness of breath with ADL's Yes   Intervention Provide education, individualized exercise plan  and daily activity instruction to help decrease symptoms of SOB with activities of daily living.   Expected Outcomes Short Term: Achieves a reduction of symptoms when performing activities of daily living.   Develop more efficient breathing techniques such as purse lipped breathing and diaphragmatic breathing; and practicing self-pacing with activity Yes   Intervention Provide education, demonstration and support about specific breathing techniuqes utilized for more efficient breathing. Include techniques such as pursed lipped breathing, diaphragmatic breathing and self-pacing activity.   Expected Outcomes Short Term: Participant will be able to demonstrate and use breathing techniques as needed throughout daily activities.   Increase knowledge of respiratory medications and ability to use respiratory devices properly  Yes  Spiriva, rarely uses Proventil   Intervention Provide education and demonstration as needed of appropriate use of medications, inhalers, and oxygen therapy.   Expected Outcomes Short Term: Achieves understanding of medications use. Understands that oxygen is a medication prescribed by physician. Demonstrates appropriate use of inhaler and oxygen therapy.      Core Components/Risk Factors/Patient Goals Review:    Core Components/Risk Factors/Patient Goals at Discharge (Final Review):    ITP Comments:     ITP Comments    Row Name 08/03/16 1135           ITP Comments Face to face meeting with Dr Emily Filbert, [place] Director today.          Comments: 30 day note review by Dr Emily Filbert, [place] Director.

## 2016-08-13 ENCOUNTER — Encounter: Payer: Medicare Other | Admitting: *Deleted

## 2016-08-13 DIAGNOSIS — J449 Chronic obstructive pulmonary disease, unspecified: Secondary | ICD-10-CM | POA: Diagnosis not present

## 2016-08-13 NOTE — Progress Notes (Signed)
Daily Session Note  Patient Details  Name: Oscar Sanchez MRN: [id number] Date of Birth: [birth year] Referring Provider:     Pulmonary Rehab from 08/03/2016 in [facility] Cardiac and Pulmonary Rehab  Referring Provider  Raul Del      Encounter Date: 08/13/2016  Check In:     Session Check In - 08/13/16 1303      Check-In   Location [facility]-Cardiac & Pulmonary Rehab   Staff Present Alberteen Sam, MA, ACSM RCEP, Exercise Physiologist;Jazman Reuter Frederico Hamman, RN BSN   Supervising physician immediately available to respond to emergencies [facility] immediately available ER MD   Physician(s) Drs. Jacqualine Code and [place]   Medication changes reported     No   Fall or balance concerns reported    No   Tobacco Cessation No Change   Warm-up and Cool-down Performed as group-led Location manager Performed Yes   VAD Patient? No     Pain Assessment   Currently in Pain? No/denies   Multiple Pain Sites No         History  Smoking Status  . Former Smoker  Smokeless Tobacco  . Never Used    Goals Met:  Proper associated with RPD/PD & O2 Sat Independence with exercise equipment Using PLB without cueing & demonstrates good technique Exercise tolerated well Strength training completed today  Goals Unmet:  Not Applicable  Comments: Pt able to follow exercise prescription today without complaint.  Will continue to monitor for progression.    Dr. Emily Filbert is Medical Director for [name] and [facility]monary [facility].

## 2016-08-16 DIAGNOSIS — J449 Chronic obstructive pulmonary disease, unspecified: Secondary | ICD-10-CM | POA: Diagnosis not present

## 2016-08-16 NOTE — Progress Notes (Signed)
Daily Session Note  Patient Details  Name: Oscar Sanchez MRN: [id number] Date of Birth: February 08, 1946 Referring Provider:     Pulmonary Rehab from 08/03/2016 in [facility] Cardiac and Pulmonary Rehab  Referring Provider  Raul Del      Encounter Date: 08/16/2016  Check In:     Session Check In - 08/16/16 1540      Check-In   Location [facility]-Cardiac & Pulmonary Rehab   Staff Present Earlean Shawl, BS, ACSM CEP, Exercise Physiologist;Laureen Owens Shark, BS, RRT, Respiratory Dareen Piano, BA, ACSM CEP, Exercise Physiologist   Supervising physician immediately available to respond to emergencies [facility] immediately available ER MD   Physician(s) Alfred Levins and Corky Downs   Medication changes reported     No   Fall or balance concerns reported    No   Warm-up and Cool-down Performed as group-led Location manager Performed Yes   VAD Patient? No     Pain Assessment   Currently in Pain? No/denies         History  Smoking Status  . Former Smoker  Smokeless Tobacco  . Never Used    Goals Met:  Proper associated with RPD/PD & O2 Sat Independence with exercise equipment Exercise tolerated well Strength training completed today  Goals Unmet:  Not Applicable  Comments: Pt able to follow exercise prescription today without complaint.  Will continue to monitor for progression.    Dr. Emily Filbert is Medical Director for [place] and [facility].

## 2016-08-20 ENCOUNTER — Encounter: Payer: Medicare Other | Admitting: *Deleted

## 2016-08-20 DIAGNOSIS — J449 Chronic obstructive pulmonary disease, unspecified: Secondary | ICD-10-CM

## 2016-08-20 NOTE — Progress Notes (Signed)
Daily Session Note  Patient Details  Name: Oscar Sanchez MRN: [id number] Date of Birth: 11/25/1945 Referring Provider:     Pulmonary Rehab from 08/03/2016 in [facility]  Referring Provider  [place]      Encounter Date: 08/20/2016  Check In:     Session Check In - 08/20/16 1120      Check-In   Location [facility]   Staff Present Susanne Bice, RN, BSN, CCRP;Jessica Hawkins, MA, ACSM RCEP, Exercise Physiologist;Krista Spencer, RN BSN;Meredith Craven, RN BSN   Supervising physician immediately available to respond to emergencies [facility] immediately available ER MD   Physician(s) Dr. Lord and Schaevitz   Medication changes reported     No   Fall or balance concerns reported    No   Warm-up and Cool-down Performed as group-led instruction   Resistance Training Performed Yes   VAD Patient? No     Pain Assessment   Currently in Pain? No/denies   Multiple Pain Sites No         History  Smoking Status  . Former Smoker  Smokeless Tobacco  . Never Used    Goals Met:  Proper associated with RPD/PD & O2 Sat Independence with exercise equipment Using PLB without cueing & demonstrates good technique Exercise tolerated well Strength training completed today  Goals Unmet:  Not Applicable  Comments: Pt able to follow exercise prescription today without complaint.  Will continue to monitor for progression.    Dr. Mark Miller is Medical Director for [facility] and [facility]. 

## 2016-08-27 ENCOUNTER — Encounter: Payer: Medicare Other | Admitting: *Deleted

## 2016-08-27 DIAGNOSIS — J449 Chronic obstructive pulmonary disease, unspecified: Secondary | ICD-10-CM | POA: Diagnosis not present

## 2016-08-27 NOTE — Progress Notes (Signed)
Daily Session Note  Patient Details  Name: Oscar Sanchez MRN: [id number] Date of Birth: 1945/05/22 Referring Provider:     Pulmonary Rehab from 08/03/2016 in [facility]  Referring Provider  Raul Del      Encounter Date: 08/27/2016  Check In:     Session Check In - 08/27/16 1307      Check-In   Staff Present Alberteen Sam, MA, ACSM RCEP, Exercise Physiologist;Meredith Sherryll Burger, RN BSN   Supervising physician immediately available to respond to emergencies [facility] immediately available ER MD   Physician(s) Dr. Burlene Arnt and Jacqualine Code   Medication changes reported     No   Fall or balance concerns reported    No   Warm-up and Cool-down Performed as group-led instruction   Resistance Training Performed Yes   VAD Patient? No     Pain Assessment   Currently in Pain? No/denies   Multiple Pain Sites No         History  Smoking Status  . Former Smoker  Smokeless Tobacco  . Never Used    Goals Met:  Proper associated with RPD/PD & O2 Sat Independence with exercise equipment Using PLB without cueing & demonstrates good technique Exercise tolerated well Strength training completed today  Goals Unmet:  Not Applicable  Comments: Pt able to follow exercise prescription today without complaint.  Will continue to monitor for progression. Took pt to visit medical director today for face to face meeting.  Dr. Caryl Comes signed and saw pt for Dr. Sabra Heck who was out.   Dr. Emily Filbert is Medical Director for [facility] and [facility].

## 2016-09-01 ENCOUNTER — Encounter: Payer: Medicare Other | Admitting: *Deleted

## 2016-09-01 DIAGNOSIS — J449 Chronic obstructive pulmonary disease, unspecified: Secondary | ICD-10-CM | POA: Diagnosis not present

## 2016-09-01 NOTE — Progress Notes (Signed)
Daily Session Note  Patient Details  Name: Oscar Sanchez MRN: [id number] Date of Birth: [birth year] Referring Provider:     Pulmonary Rehab from 08/03/2016 in [facility] Cardiac and Pulmonary Rehab  Referring Provider  Raul Del      Encounter Date: 09/01/2016  Check In:     Session Check In - 09/01/16 1125      Check-In   Location [facility]   Staff Present Alberteen Sam, MA, ACSM RCEP, Exercise Physiologist;Laureen Owens Shark, BS, RRT, Respiratory Therapist;Meredith Sherryll Burger, RN BSN   Supervising physician immediately available to respond to emergencies [facility] immediately available ER MD   Physician(s) Drs. Paduchowski and Williams   Medication changes reported     No   Fall or balance concerns reported    No   Warm-up and Cool-down Performed as group-led Location manager Performed Yes   VAD Patient? No     Pain Assessment   Currently in Pain? No/denies   Multiple Pain Sites No         History  Smoking Status  . Former Smoker  Smokeless Tobacco  . Never Used    Goals Met:  Proper associated with RPD/PD & O2 Sat Independence with exercise equipment Using PLB without cueing & demonstrates good technique Exercise tolerated well Strength training completed today  Goals Unmet:  Not Applicable  Comments: Pt able to follow exercise prescription today without complaint.  Will continue to monitor for progression.    Dr. Emily Filbert is Medical Director for [facility] and [facility].

## 2016-09-03 ENCOUNTER — Encounter: Payer: Medicare Other | Attending: Specialist | Admitting: *Deleted

## 2016-09-03 DIAGNOSIS — Z87891 Personal history of nicotine dependence: Secondary | ICD-10-CM | POA: Diagnosis not present

## 2016-09-03 DIAGNOSIS — J449 Chronic obstructive pulmonary disease, unspecified: Secondary | ICD-10-CM | POA: Insufficient documentation

## 2016-09-03 NOTE — Progress Notes (Signed)
Daily Session Note  Patient Details  Name: Oscar Sanchez MRN: [id number] Date of Birth: September 10, 1945 Referring Provider:     Pulmonary Rehab from 08/03/2016 in [facility] Cardiac and Pulmonary Rehab  Referring Provider  Raul Del      Encounter Date: 09/03/2016  Check In:     Session Check In - 09/03/16 1132      Check-In   Location [facility]   Staff Present Nada Maclachlan, BA, ACSM CEP, Exercise Physiologist;Jessica Luan Pulling, MA, ACSM RCEP, Exercise Physiologist;Meredith Sherryll Burger, RN BSN;Susanne Bice, RN, BSN, CCRP   Supervising physician immediately available to respond to emergencies [facility] immediately available ER MD   Physician(s) Dr. Burlene Arnt and Alfred Levins    Medication changes reported     No   Fall or balance concerns reported    No   Tobacco Cessation No Change   Warm-up and Cool-down Performed as group-led instruction   Resistance Training Performed Yes   VAD Patient? No     Pain Assessment   Currently in Pain? No/denies         History  Smoking Status  . Former Smoker  Smokeless Tobacco  . Never Used    Goals Met:  Proper associated with RPD/PD & O2 Sat Independence with exercise equipment Using PLB without cueing & demonstrates good technique Exercise tolerated well Strength training completed today  Goals Unmet:  Not Applicable  Comments: Pt able to follow exercise prescription today without complaint.  Will continue to monitor for progression.    Dr. Emily Filbert is Medical Director for [facility] and [facility].

## 2016-09-06 ENCOUNTER — Encounter: Payer: Self-pay | Admitting: Respiratory Therapy

## 2016-09-06 DIAGNOSIS — J449 Chronic obstructive pulmonary disease, unspecified: Secondary | ICD-10-CM

## 2016-09-06 NOTE — Progress Notes (Signed)
Pulmonary Individual Treatment Plan  Patient Details  Name: Oscar Sanchez MRN: [id number] Date of Birth: 1945/05/15 Referring Provider:     Pulmonary Rehab from 08/03/2016 in [facility] Cardiac and Pulmonary Rehab  Referring Provider  Raul Del      Initial Encounter Date:    Pulmonary Rehab from 08/03/2016 in [facility] Cardiac and Pulmonary Rehab  Date  08/03/16  Referring Provider  Raul Del      Visit Diagnosis: Chronic obstructive pulmonary disease, unspecified COPD type ([place])  Patient's Home Medications on Admission:  Current Outpatient Prescriptions:    Diphenhyd-Hydrocort-Nystatin (FIRST-DUKES MOUTHWASH) SUSP, Use as directed 10 mLs in the mouth or throat 4 (four) times daily. Mixed with 5 mL of viscous lidocaine for swish and swallow, Disp: 237 mL, Rfl: 0   lidocaine (XYLOCAINE) 2 % solution, Use as directed 5 mLs in the mouth or throat every 6 (six) hours as needed for mouth pain. Mixed with [facility] mouthwash swish and swallow., Disp: 100 mL, Rfl: 0   tiotropium (SPIRIVA) 18 MCG inhalation capsule, Place 18 mcg into inhaler and inhale daily., Disp: , Rfl:   Past Medical History: No past medical history on file.  Tobacco Use: History  Smoking Status   Former Smoker  Smokeless Tobacco   Never Used    Labs: Recent Review Flowsheet Data    There is no flowsheet data to display.       ADL UCSD:     Pulmonary Assessment Scores    Row Name 08/03/16 1132         ADL UCSD   ADL Phase Entry     SOB Score total 13     Rest 0     Walk 0     Stairs 3     Bath 0     Dress 0     Shop 0        Pulmonary Function Assessment:     Pulmonary Function Assessment - 08/03/16 1131      Pulmonary Function Tests   RV% 50 %   DLCO% 70 %     Initial Spirometry Results   FVC% 80 %   FEV1% 64 %   FEV1/FVC Ratio 63   Comments test date 11/04/15     Post Bronchodilator Spirometry Results   FVC% 89 %   FEV1% 67 %   FEV1/FVC Ratio 60     Breath   Bilateral Breath Sounds  Clear   Shortness of Breath Yes      Exercise Target Goals:    Exercise Program Goal: Individual exercise prescription set with THRR, safety & activity barriers. Participant demonstrates ability to understand and report RPE using BORG scale, to self-measure pulse accurately, and to acknowledge the importance of the exercise prescription.  Exercise Prescription Goal: Starting with aerobic activity 30 plus minutes a day, 3 days per week for initial exercise prescription. Provide home exercise prescription and guidelines that participant acknowledges understanding prior to discharge.  Activity Barriers & Risk Stratification:   6 Minute Walk:     6 Minute Walk    Row Name 08/03/16 1127         6 Minute Walk   Distance 1600 feet     Walk Time 6 minutes     # of Rest Breaks 0     MPH 3.03     METS 3.89     RPE 13     Perceived Dyspnea  2     VO2 Peak 13.6  Symptoms No     Resting HR 67 bpm     Resting BP 120/70     Max Ex. HR 112 bpm     Max Ex. BP 142/62       Interval HR   3 Minute HR 105     6 Minute HR 112     Interval Heart Rate? Yes       Interval Oxygen   Interval Oxygen? Yes     Baseline Oxygen Saturation % 96 %     3 Minute Oxygen Saturation % 91 %     6 Minute Oxygen Saturation % 89 %       Oxygen Initial Assessment:     Oxygen Initial Assessment - 08/03/16 1136      Home Oxygen   Home Oxygen Device None      Oxygen Re-Evaluation:   Oxygen Discharge (Final Oxygen Re-Evaluation):   Initial Exercise Prescription:     Initial Exercise Prescription - 08/03/16 1100      Date of Initial Exercise RX and Referring Provider   Date 08/03/16   Referring Provider Raul Del     Treadmill   MPH 2.8   Grade 1   Minutes 15   METs 3.53     Recumbant Bike   Level 3   RPM 60   Watts 35   Minutes 15   METs 3.5     Elliptical   Level 1   Speed 3   Minutes 15     Prescription Details   Frequency (times per week) 3   Duration Progress to  45 minutes of aerobic exercise without signs/symptoms of physical distress     Intensity   THRR 40-80% of Max Heartrate 99-132   Ratings of Perceived Exertion 11-15     Resistance Training   Training Prescription Yes   Weight 3   Reps 10-15      Perform Capillary Blood Glucose checks as needed.  Exercise Prescription Changes:     Exercise Prescription Changes    Row Name 08/09/16 1300 08/24/16 1400           Response to Exercise   Blood Pressure (Admit) 110/62 128/80      Blood Pressure (Exercise) 150/66 136/70      Blood Pressure (Exit) 110/68 126/74      Heart Rate (Admit) 85 bpm 79 bpm      Heart Rate (Exercise) 133 bpm 114 bpm      Heart Rate (Exit) 102 bpm 108 bpm      Oxygen Saturation (Admit) 97 % 94 %      Oxygen Saturation (Exercise) 92 % 92 %      Oxygen Saturation (Exit) 92 % 94 %      Rating of Perceived Exertion (Exercise) 13 13      Perceived Dyspnea (Exercise) 2 2      Symptoms  -- none      Duration Progress to 45 minutes of aerobic exercise without signs/symptoms of physical distress Continue with 45 min of aerobic exercise without signs/symptoms of physical distress.      Intensity THRR unchanged THRR unchanged        Progression   Progression Continue to progress workloads to maintain intensity without signs/symptoms of physical distress. Continue to progress workloads to maintain intensity without signs/symptoms of physical distress.      Average METs  -- 3.61        Resistance Training   Training Prescription Yes Yes  Weight 3 3      Reps 10-15 10-15        Interval Training   Interval Training  -- No        Treadmill   MPH 2.8 2.8      Grade 1 1      Minutes 15 15      METs  -- 3.53        Recumbant Bike   Level 3 3      RPM 80 60      Watts  -- 30      Minutes 10 15      METs  -- 3.53        Elliptical   Level 1 1      Speed  -- 3.1      Minutes 5 15         Exercise Comments:     Exercise Comments    Row Name  08/10/16 1526           Exercise Comments First full day of exercise!  Patient was oriented to gym and equipment including functions, settings, policies, and procedures.  Patient's individual exercise prescription and treatment plan were reviewed.  All starting workloads were established based on the results of the 6 minute walk test done at initial orientation visit.  The plan for exercise progression was also introduced and progression will be customized based on patient's performance and goals.          Exercise Goals and Review:     Exercise Goals    Row Name 08/03/16 1133             Exercise Goals   Increase Physical Activity Yes       Intervention Provide advice, education, support and counseling about physical activity/exercise needs.;Develop an individualized exercise prescription for aerobic and resistive training based on initial evaluation findings, risk stratification, comorbidities and participant's personal goals.       Expected Outcomes Achievement of increased cardiorespiratory fitness and enhanced flexibility, muscular endurance and strength shown through measurements of functional capacity and personal statement of participant.       Increase Strength and Stamina Yes       Intervention Provide advice, education, support and counseling about physical activity/exercise needs.;Develop an individualized exercise prescription for aerobic and resistive training based on initial evaluation findings, risk stratification, comorbidities and participant's personal goals.       Expected Outcomes Achievement of increased cardiorespiratory fitness and enhanced flexibility, muscular endurance and strength shown through measurements of functional capacity and personal statement of participant.          Exercise Goals Re-Evaluation :     Exercise Goals Re-Evaluation    Row Name 08/10/16 1525 08/24/16 1408           Exercise Goal Re-Evaluation   Exercise Goals Review Increase  Physical Activity;Increase Strenth and Stamina Increase Physical Activity;Increase Strenth and Stamina      Comments Konrad Dolores has completed his first full day of exercise!  We will work with him on progression. Konrad Dolores has been doing well in rehab.  He attends on Wednesdays and Fridays.  He is now up to 25 Watts on the recumbent bike.  We will continue to work on his progression.      Expected Outcomes Short and Long: Come to class regularly to improve his strength and stamina. Short: Begin to increase workloads.  Long: Build more stamina and increase overall physical activity.  Discharge Exercise Prescription (Final Exercise Prescription Changes):     Exercise Prescription Changes - 08/24/16 1400      Response to Exercise   Blood Pressure (Admit) 128/80   Blood Pressure (Exercise) 136/70   Blood Pressure (Exit) 126/74   Heart Rate (Admit) 79 bpm   Heart Rate (Exercise) 114 bpm   Heart Rate (Exit) 108 bpm   Oxygen Saturation (Admit) 94 %   Oxygen Saturation (Exercise) 92 %   Oxygen Saturation (Exit) 94 %   Rating of Perceived Exertion (Exercise) 13   Perceived Dyspnea (Exercise) 2   Symptoms none   Duration Continue with 45 min of aerobic exercise without signs/symptoms of physical distress.   Intensity THRR unchanged     Progression   Progression Continue to progress workloads to maintain intensity without signs/symptoms of physical distress.   Average METs 3.61     Resistance Training   Training Prescription Yes   Weight 3   Reps 10-15     Interval Training   Interval Training No     Treadmill   MPH 2.8   Grade 1   Minutes 15   METs 3.53     Recumbant Bike   Level 3   RPM 60   Watts 30   Minutes 15   METs 3.53     Elliptical   Level 1   Speed 3.1   Minutes 15      Nutrition:  Target Goals: Understanding of nutrition guidelines, daily intake of sodium '1500mg'$ , cholesterol '200mg'$ , calories 30% from fat and 7% or less from saturated fats, daily to have 5  or more servings of fruits and vegetables.  Biometrics:     Pre Biometrics - 08/03/16 1126      Pre Biometrics   Height '5\' 8"'$  (1.727 m)   Weight 148 lb 1.6 oz (67.2 kg)   Waist Circumference 38 inches   Hip Circumference 40.75 inches   Waist to Hip Ratio 0.93 %   BMI (Calculated) 22.6       Nutrition Therapy Plan and Nutrition Goals:   Nutrition Discharge: Rate Your Plate Scores:   Nutrition Goals Re-Evaluation:   Nutrition Goals Discharge (Final Nutrition Goals Re-Evaluation):   Psychosocial: Target Goals: Acknowledge presence or absence of significant depression and/or stress, maximize coping skills, provide positive support system. Participant is able to verbalize types and ability to use techniques and skills needed for reducing stress and depression.   Initial Review & Psychosocial Screening:     Initial Psych Review & Screening - 08/03/16 [place]? Yes   Comments Mr Browe has good support from his church family. He is divorced and no children. 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 At age 32, he found God and has been very blessed. 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 He is looking forward to [place] for improve his shortness of breath.and increased stamina.     Barriers   Psychosocial barriers to participate in program The patient should benefit from training in stress management and relaxation.     Screening Interventions   Interventions Encouraged to exercise      Quality of Life Scores:     Quality of Life - 08/03/16 1142      Quality of Life Scores   Health/Function Pre 20.67 %   Socioeconomic Pre 20.71 %  Psych/Spiritual Pre 21 %   Family Pre 20.33 %   GLOBAL Pre 20.72 %      PHQ-9: Recent Review Flowsheet Data    Depression screen [facility] 2/9 08/03/2016   Decreased Interest 0   Down, Depressed, Hopeless 0   PHQ - 2 Score  0   Altered sleeping 3   Tired, decreased energy 1   Change in appetite 0   Feeling bad or failure about yourself  0   Trouble concentrating 0   Moving slowly or fidgety/restless 0   Suicidal thoughts 0   PHQ-9 Score 4   Difficult doing work/chores Not difficult at all     Interpretation of Total Score  Total Score Depression Severity:  1-4 = Minimal depression, 5-9 = Mild depression, 10-14 = Moderate depression, 15-19 = Moderately severe depression, 20-27 = Severe depression   Psychosocial Evaluation and Intervention:     Psychosocial Evaluation - 08/09/16 1242      Psychosocial Evaluation & Interventions   Interventions Encouraged to exercise with the program and follow exercise prescription   Comments Counselor met with mr. Reymundo Poll Konrad Dolores) today for initial psychosocial evaluation.  He is a 71 year old who has been diagnosed with COPD.  He lives alone but has several sisters who live locally and he is actively involved in his local church.  Tommy reports he does not sleep well but he admits to "drinking a lot of coffee."  Counselor encouraged him to switch to decaf after noon and he agreed to try this.  He denies a history of depression or anxiety or current symptoms.  He states he is typically in a positive mood and he has minimal stress in his life - and relies on his faith for any stress that arises.  He has goals to increase his education about his disease and ways to improve his health.  He desires to increase his stamina and strength while in this program.  Staff will be following with him throughout the course of this program.    Expected Outcomes Tommy will exercise consistently to achieve his stated goals.  He will benefit from the educational components as well.     Continue Psychosocial Services  Follow up required by staff      Psychosocial Re-Evaluation:   Psychosocial Discharge (Final Psychosocial Re-Evaluation):   Education: Education Goals: Education classes will  be provided on a weekly basis, covering required topics. Participant will state understanding/return demonstration of topics presented.  Learning Barriers/Preferences:     Learning Barriers/Preferences - 08/03/16 1131      Learning Barriers/Preferences   Learning Barriers None   Learning Preferences None      Education Topics: Initial Evaluation Education: - Verbal, written and demonstration of respiratory meds, RPE/PD scales, oximetry and breathing techniques. Instruction on use of nebulizers and MDIs: cleaning and proper use, rinsing mouth with steroid doses and importance of monitoring MDI activations.   Pulmonary Rehab from 09/03/2016 in [facility] Cardiac and Pulmonary Rehab  Date  08/03/16  Educator  LB  Instruction Review Code  2- meets goals/outcomes      General Nutrition Guidelines/Fats and Fiber: -Group instruction provided by verbal, written material, models and posters to present the general guidelines for heart healthy nutrition. Gives an explanation and review of dietary fats and fiber.   Controlling Sodium/Reading Food Labels: -Group verbal and written material supporting the discussion of sodium use in heart healthy nutrition. Review and explanation with models, verbal and written materials for utilization of the  food label.   Pulmonary Rehab from 09/03/2016 in [facility] Cardiac and Pulmonary Rehab  Date  08/09/16  Educator  CR  Instruction Review Code  2- meets goals/outcomes      Exercise Physiology & Risk Factors: - Group verbal and written instruction with models to review the exercise physiology of the cardiovascular system and associated critical values. Details cardiovascular disease risk factors and the goals associated with each risk factor.   Aerobic Exercise & Resistance Training: - Gives group verbal and written discussion on the health impact of inactivity. On the components of aerobic and resistive training programs and the benefits of this training and how to  safely progress through these programs.   Flexibility, Balance, General Exercise Guidelines: - Provides group verbal and written instruction on the benefits of flexibility and balance training programs. Provides general exercise guidelines with specific guidelines to those with heart or lung disease. Demonstration and skill practice provided.   Pulmonary Rehab from 09/03/2016 in [facility] Cardiac and Pulmonary Rehab  Date  09/03/16  Educator  AS  Instruction Review Code  2- meets goals/outcomes      Stress Management: - Provides group verbal and written instruction about the health risks of elevated stress, cause of high stress, and healthy ways to reduce stress.   Depression: - Provides group verbal and written instruction on the correlation between heart/lung disease and depressed mood, treatment options, and the stigmas associated with seeking treatment.   Pulmonary Rehab from 09/03/2016 in [facility] Cardiac and Pulmonary Rehab  Date  09/01/16  Educator  [facility]  Instruction Review Code  2- meets goals/outcomes      Exercise & Equipment Safety: - Individual verbal instruction and demonstration of equipment use and safety with use of the equipment.   Pulmonary Rehab from 09/03/2016 in [facility] Cardiac and Pulmonary Rehab  Date  08/09/16  Educator  LB  Instruction Review Code  2- meets goals/outcomes      Infection Prevention: - Provides verbal and written material to individual with discussion of infection control including proper hand washing and proper equipment cleaning during exercise session.   Pulmonary Rehab from 09/03/2016 in [facility] Cardiac and Pulmonary Rehab  Date  08/03/16  Educator  LB  Instruction Review Code  2- meets goals/outcomes      Falls Prevention: - Provides verbal and written material to individual with discussion of falls prevention and safety.   Pulmonary Rehab from 09/03/2016 in [facility] Cardiac and Pulmonary Rehab  Date  08/03/16  Educator  LB  Instruction Review Code  2-  meets goals/outcomes      Diabetes: - Individual verbal and written instruction to review signs/symptoms of diabetes, desired ranges of glucose level fasting, after meals and with exercise. Advice that pre and post exercise glucose checks will be done for 3 sessions at entry of program.   Chronic Lung Diseases: - Group verbal and written instruction to review new updates, new respiratory medications, new advancements in procedures and treatments. Provide informative websites and "800" numbers of self-education.   Lung Procedures: - Group verbal and written instruction to describe testing methods done to diagnose lung disease. Review the outcome of test results. Describe the treatment choices: Pulmonary Function Tests, ABGs and oximetry.   Energy Conservation: - Provide group verbal and written instruction for methods to conserve energy, plan and organize activities. Instruct on pacing techniques, use of adaptive equipment and posture/positioning to relieve shortness of breath.   Triggers: - Group verbal and written instruction to review types of environmental  controls: home humidity, furnaces, filters, dust mite/pet prevention, HEPA vacuums. To discuss weather changes, air quality and the benefits of nasal washing.   Exacerbations: - Group verbal and written instruction to provide: warning signs, infection symptoms, calling MD promptly, preventive modes, and value of vaccinations. Review: effective airway clearance, coughing and/or vibration techniques. Create an Sports administrator.   Oxygen: - Individual and group verbal and written instruction on oxygen therapy. Includes supplement oxygen, available portable oxygen systems, continuous and intermittent flow rates, oxygen safety, concentrators, and Medicare reimbursement for oxygen.   Respiratory Medications: - Group verbal and written instruction to review medications for lung disease. Drug class, frequency, complications, importance of  spacers, rinsing mouth after steroid MDI's, and proper cleaning methods for nebulizers.   Pulmonary Rehab from 09/03/2016 in [facility] Cardiac and Pulmonary Rehab  Date  08/03/16  Educator  LB  Instruction Review Code  2- meets goals/outcomes      AED/CPR: - Group verbal and written instruction with the use of models to demonstrate the basic use of the AED with the basic ABC's of resuscitation.   Breathing Retraining: - Provides individuals verbal and written instruction on purpose, frequency, and proper technique of diaphragmatic breathing and pursed-lipped breathing. Applies individual practice skills.   Pulmonary Rehab from 09/03/2016 in [facility] Cardiac and Pulmonary Rehab  Date  08/03/16  Educator  LB  Instruction Review Code  2- meets goals/outcomes      Anatomy and Physiology of the Lungs: - Group verbal and written instruction with the use of models to provide basic lung anatomy and physiology related to function, structure and complications of lung disease.   Heart Failure: - Group verbal and written instruction on the basics of heart failure: signs/symptoms, treatments, explanation of ejection fraction, enlarged heart and cardiomyopathy.   Pulmonary Rehab from 09/03/2016 in [facility] Cardiac and Pulmonary Rehab  Date  08/20/16  Educator  CE  Instruction Review Code  2- meets goals/outcomes      Sleep Apnea: - Individual verbal and written instruction to review Obstructive Sleep Apnea. Review of risk factors, methods for diagnosing and types of masks and machines for OSA.   Anxiety: - Provides group, verbal and written instruction on the correlation between heart/lung disease and anxiety, treatment options, and management of anxiety.   Relaxation: - Provides group, verbal and written instruction about the benefits of relaxation for patients with heart/lung disease. Also provides patients with examples of relaxation techniques.   Knowledge Questionnaire Score:     Knowledge  Questionnaire Score - 08/03/16 1131      Knowledge Questionnaire Score   Pre Score 6/10       Core Components/Risk Factors/Patient Goals at Admission:     Personal Goals and Risk Factors at Admission - 08/03/16 1135      Core Components/Risk Factors/Patient Goals on Admission   Improve shortness of breath with ADL's Yes   Intervention Provide education, individualized exercise plan and daily activity instruction to help decrease symptoms of SOB with activities of daily living.   Expected Outcomes Short Term: Achieves a reduction of symptoms when performing activities of daily living.   Develop more efficient breathing techniques such as purse lipped breathing and diaphragmatic breathing; and practicing self-pacing with activity Yes   Intervention Provide education, demonstration and support about specific breathing techniuqes utilized for more efficient breathing. Include techniques such as pursed lipped breathing, diaphragmatic breathing and self-pacing activity.   Expected Outcomes Short Term: Participant will be able to demonstrate and use breathing techniques as  needed throughout daily activities.   Increase knowledge of respiratory medications and ability to use respiratory devices properly  Yes  Spiriva, rarely uses Proventil   Intervention Provide education and demonstration as needed of appropriate use of medications, inhalers, and oxygen therapy.   Expected Outcomes Short Term: Achieves understanding of medications use. Understands that oxygen is a medication prescribed by physician. Demonstrates appropriate use of inhaler and oxygen therapy.      Core Components/Risk Factors/Patient Goals Review:    Core Components/Risk Factors/Patient Goals at Discharge (Final Review):    ITP Comments:     ITP Comments    Row Name 08/03/16 1135 08/27/16 1306 09/06/16 0850       ITP Comments Face to face meeting with Dr Emily Filbert, [place] Director today. Took pt to visit  medical director today for face to face meeting.  Dr. Caryl Comes signed and saw pt for Dr. Sabra Heck who was out. 30 day note review by Dr Emily Filbert, Medical Director of [facility]        Comments: 30 day note review by Dr Emily Filbert, Medical Director of [place]

## 2016-09-06 NOTE — Progress Notes (Signed)
Daily Session Note  Patient Details  Name: Oscar Sanchez MRN: [id number] Date of Birth: 15-Jun-1945 Referring Provider:     Pulmonary Rehab from 08/03/2016 in [facility] Cardiac and Pulmonary Rehab  Referring Provider  Raul Del      Encounter Date: 09/06/2016  Check In:     Session Check In - 09/06/16 1543      Check-In   Location [facility]-Cardiac & Pulmonary Rehab   Staff Present Carson Myrtle, BS, RRT, Respiratory Dareen Piano, BA, ACSM CEP, Exercise Physiologist;Kelly Amedeo Plenty, BS, ACSM CEP, Exercise Physiologist   Supervising physician immediately available to respond to emergencies [facility] immediately available ER MD   Physician(s) Kerman Passey and Schaevitz   Medication changes reported     No   Fall or balance concerns reported    No   Warm-up and Cool-down Performed as group-led instruction   Resistance Training Performed Yes   VAD Patient? No     Pain Assessment   Currently in Pain? No/denies         History  Smoking Status  . Former Smoker  Smokeless Tobacco  . Never Used    Goals Met:  Proper associated with RPD/PD & O2 Sat Independence with exercise equipment Exercise tolerated well Strength training completed today  Goals Unmet:  Not Applicable  Comments: Pt able to follow exercise prescription today without complaint.  Will continue to monitor for progression.    Dr. Emily Filbert is Medical Director for [place] and [facility].

## 2016-09-08 ENCOUNTER — Encounter: Payer: Medicare Other | Admitting: *Deleted

## 2016-09-08 DIAGNOSIS — J449 Chronic obstructive pulmonary disease, unspecified: Secondary | ICD-10-CM | POA: Diagnosis not present

## 2016-09-08 NOTE — Progress Notes (Signed)
Daily Session Note  Patient Details  Name: Oscar Sanchez MRN: [id number] Date of Birth: [birth year] Referring Provider:     Pulmonary Rehab from 08/03/2016 in [facility] Cardiac and Pulmonary Rehab  Referring Provider  Raul Del      Encounter Date: 09/08/2016  Check In:     Session Check In - 09/08/16 1129      Check-In   Location [facility]-Cardiac & Pulmonary Rehab   Staff Present Alberteen Sam, MA, ACSM RCEP, Exercise Physiologist;Susanne Bice, RN, BSN, CCRP;Laureen Owens Shark, BS, RRT, Respiratory Therapist   Supervising physician immediately available to respond to emergencies [facility] immediately available ER MD   Physician(s) Drs. Darl Householder and McShane   Medication changes reported     No   Fall or balance concerns reported    No   Warm-up and Cool-down Performed as group-led Location manager Performed Yes   VAD Patient? No     Pain Assessment   Currently in Pain? No/denies   Multiple Pain Sites No           Exercise Prescription Changes - 09/08/16 1400      Response to Exercise   Blood Pressure (Admit) 128/68   Blood Pressure (Exercise) 128/60   Blood Pressure (Exit) 112/60   Heart Rate (Exercise) 128 bpm   Heart Rate (Exit) 98 bpm   Oxygen Saturation (Admit) 94 %   Oxygen Saturation (Exercise) 90 %   Oxygen Saturation (Exit) 93 %   Rating of Perceived Exertion (Exercise) 13   Perceived Dyspnea (Exercise) 2   Symptoms none   Duration Continue with 45 min of aerobic exercise without signs/symptoms of physical distress.   Intensity THRR unchanged     Progression   Progression Continue to progress workloads to maintain intensity without signs/symptoms of physical distress.   Average METs 3.37     Resistance Training   Training Prescription Yes   Weight 4 lbs   Reps 10-15     Interval Training   Interval Training No     Treadmill   MPH 2.8   Grade 1   Minutes 15   METs 3.53     Recumbant Bike   Level 3   RPM 65   Watts 23   Minutes 15   METs  3.21     Elliptical   Level 1   Speed 3.7   Minutes 15     Home Exercise Plan   Plans to continue exercise at [employer] (comment)  [place]   Frequency Add 2 additional days to program exercise sessions.   Initial Home Exercises Provided 08/20/16      History  Smoking Status  . Former Smoker  Smokeless Tobacco  . Never Used    Goals Met:  Proper associated with RPD/PD & O2 Sat Independence with exercise equipment Using PLB without cueing & demonstrates good technique Exercise tolerated well Strength training completed today  Goals Unmet:  Not Applicable  Comments: Pt able to follow exercise prescription today without complaint.  Will continue to monitor for progression.    Dr. Emily Filbert is Medical Director for [place] and [facility].

## 2016-09-13 DIAGNOSIS — J449 Chronic obstructive pulmonary disease, unspecified: Secondary | ICD-10-CM

## 2016-09-13 NOTE — Progress Notes (Signed)
Daily Session Note  Patient Details  Name: Oscar Sanchez MRN: [id number] Date of Birth: 08-02-1945 Referring Provider:     Pulmonary Rehab from 08/03/2016 in [facility] Cardiac and Pulmonary Rehab  Referring Provider  Raul Del      Encounter Date: 09/13/2016  Check In:     Session Check In - 09/13/16 1256      Check-In   Location [facility]-Cardiac & Pulmonary Rehab   Staff Present Carson Myrtle, BS, RRT, Respiratory Dareen Piano, BA, ACSM CEP, Exercise Physiologist;Kelly Amedeo Plenty, BS, ACSM CEP, Exercise Physiologist   Supervising physician immediately available to respond to emergencies [facility] immediately available ER MD   Medication changes reported     No   Fall or balance concerns reported    No   Warm-up and Cool-down Performed as group-led instruction   Resistance Training Performed Yes   VAD Patient? No     Pain Assessment   Currently in Pain? No/denies         History  Smoking Status  . Former Smoker  Smokeless Tobacco  . Never Used    Goals Met:  Proper associated with RPD/PD & O2 Sat Independence with exercise equipment Exercise tolerated well Strength training completed today  Goals Unmet:  Not Applicable  Comments: Pt able to follow exercise prescription today without complaint.  Will continue to monitor for progression.    Dr. Emily Filbert is Medical Director for [place] and [facility].

## 2016-09-15 ENCOUNTER — Encounter: Payer: Medicare Other | Admitting: *Deleted

## 2016-09-15 DIAGNOSIS — J449 Chronic obstructive pulmonary disease, unspecified: Secondary | ICD-10-CM | POA: Diagnosis not present

## 2016-09-15 NOTE — Progress Notes (Signed)
Daily Session Note  Patient Details  Name: Oscar Sanchez MRN: [id number] Date of Birth: 02-01-1946 Referring Provider:     Pulmonary Rehab from 08/03/2016 in [facility] Cardiac and Pulmonary Rehab  Referring Provider  Raul Del      Encounter Date: 09/15/2016  Check In:     Session Check In - 09/15/16 1138      Check-In   Location [facility]-Cardiac & Pulmonary Rehab   Staff Present Carson Myrtle, BS, RRT, Respiratory Therapist;Johnita Palleschi Sherryll Burger, RN BSN;Jessica Luan Pulling, MA, ACSM RCEP, Exercise Physiologist   Supervising physician immediately available to respond to emergencies [facility] immediately available ER MD   Physician(s) Dr. Kerman Passey and Mariea Clonts   Medication changes reported     No   Fall or balance concerns reported    No   Warm-up and Cool-down Performed as group-led instruction   Resistance Training Performed Yes   VAD Patient? No     Pain Assessment   Currently in Pain? No/denies         History  Smoking Status  . Former Smoker  Smokeless Tobacco  . Never Used    Goals Met:  Proper associated with RPD/PD & O2 Sat Independence with exercise equipment Using PLB without cueing & demonstrates good technique Exercise tolerated well Strength training completed today  Goals Unmet:  Not Applicable  Comments: Pt able to follow exercise prescription today without complaint.  Will continue to monitor for progression.   Dr. Emily Filbert is Medical Director for [place] and [facility].

## 2016-09-20 ENCOUNTER — Encounter: Payer: Medicare Other | Admitting: Respiratory Therapy

## 2016-09-20 DIAGNOSIS — J449 Chronic obstructive pulmonary disease, unspecified: Secondary | ICD-10-CM

## 2016-09-20 NOTE — Progress Notes (Signed)
Daily Session Note  Patient Details  Name: Oscar Sanchez MRN: [id number] Date of Birth: [birth year] Referring Provider:     Pulmonary Rehab from 08/03/2016 in [facility] Cardiac and Pulmonary Rehab  Referring Provider  Raul Del      Encounter Date: 09/20/2016  Check In:     Session Check In - 09/20/16 1416      Check-In   Location [facility]-Cardiac & Pulmonary Rehab   Staff Present Justin Mend RCP,RRT,BSRT;Amanda Oletta Darter, BA, ACSM CEP, Exercise Physiologist;Laureen Owens Shark, BS, RRT, Respiratory Bertis Ruddy, BS, ACSM CEP, Exercise Physiologist   Supervising physician immediately available to respond to emergencies [facility] immediately available ER MD   Physician(s) Dr.Mcshane and Mariea Clonts   Medication changes reported     No   Fall or balance concerns reported    No   Tobacco Cessation No Change   Warm-up and Cool-down Performed as group-led instruction   Resistance Training Performed Yes   VAD Patient? No     Pain Assessment   Currently in Pain? No/denies   Multiple Pain Sites No         History  Smoking Status  . Former Smoker  Smokeless Tobacco  . Never Used    Goals Met:  Proper associated with RPD/PD & O2 Sat Independence with exercise equipment Exercise tolerated well Personal goals reviewed Queuing for purse lip breathing Strength training completed today  Goals Unmet:  Not Applicable  Comments: Pt able to follow exercise prescription today without complaint.  Will continue to monitor for progression.   Dr. Emily Filbert is Medical Director for [place] and [facility].

## 2016-09-22 ENCOUNTER — Encounter: Payer: Medicare Other | Admitting: *Deleted

## 2016-09-22 DIAGNOSIS — J449 Chronic obstructive pulmonary disease, unspecified: Secondary | ICD-10-CM

## 2016-09-22 NOTE — Progress Notes (Signed)
Daily Session Note  Patient Details  Name: Oscar Sanchez MRN: [id number] Date of Birth: [birth year] Referring Provider:     Pulmonary Rehab from 08/03/2016 in [facility] Cardiac and Pulmonary Rehab  Referring Provider  Raul Del      Encounter Date: 09/22/2016  Check In:     Session Check In - 09/22/16 1144      Check-In   Location [facility]-Cardiac & Pulmonary Rehab   Staff Present Carson Myrtle, BS, RRT, Respiratory Therapist;Taiwan Talcott Sherryll Burger, RN BSN;Joseph Darrin Nipper, [place], ACSM RCEP, Exercise Physiologist   Supervising physician immediately available to respond to emergencies [facility] immediately available ER MD   Physician(s) Dr. Alfred Levins and Jimmye Norman    Medication changes reported     No   Fall or balance concerns reported    No   Warm-up and Cool-down Performed as group-led instruction   Resistance Training Performed Yes   VAD Patient? No     Pain Assessment   Currently in Pain? No/denies         History  Smoking Status  . Former Smoker  Smokeless Tobacco  . Never Used    Goals Met:  Proper associated with RPD/PD & O2 Sat Independence with exercise equipment Using PLB without cueing & demonstrates good technique Exercise tolerated well Strength training completed today  Goals Unmet:  Not Applicable  Comments: Pt able to follow exercise prescription today without complaint.  Will continue to monitor for progression.    Dr. Emily Filbert is Medical Director for [place] and [facility].

## 2016-10-04 ENCOUNTER — Telehealth: Payer: Self-pay

## 2016-10-04 ENCOUNTER — Encounter: Payer: Self-pay | Admitting: Respiratory Therapy

## 2016-10-04 ENCOUNTER — Encounter: Payer: Medicare Other | Attending: Specialist

## 2016-10-04 DIAGNOSIS — J449 Chronic obstructive pulmonary disease, unspecified: Secondary | ICD-10-CM

## 2016-10-04 DIAGNOSIS — Z87891 Personal history of nicotine dependence: Secondary | ICD-10-CM | POA: Insufficient documentation

## 2016-10-04 NOTE — Telephone Encounter (Signed)
Called Oscar Sanchez to see if he was doing ok and why he was not in class last week. His sister had lung surgery and has been with her since she had a brain aneurysm post surgery. He states when things calm down he will be back.

## 2016-10-04 NOTE — Progress Notes (Signed)
Pulmonary Individual Treatment Plan  Patient Details  Name: Oscar Sanchez MRN: [id number] Date of Birth: 1945/05/15 Referring Provider:     Pulmonary Rehab from 08/03/2016 in [facility] Cardiac and Pulmonary Rehab  Referring Provider  Raul Del      Initial Encounter Date:    Pulmonary Rehab from 08/03/2016 in [facility] Cardiac and Pulmonary Rehab  Date  08/03/16  Referring Provider  Raul Del      Visit Diagnosis: Chronic obstructive pulmonary disease, unspecified COPD type ([place])  Patient's Home Medications on Admission:  Current Outpatient Prescriptions:    Diphenhyd-Hydrocort-Nystatin (FIRST-DUKES MOUTHWASH) SUSP, Use as directed 10 mLs in the mouth or throat 4 (four) times daily. Mixed with 5 mL of viscous lidocaine for swish and swallow, Disp: 237 mL, Rfl: 0   lidocaine (XYLOCAINE) 2 % solution, Use as directed 5 mLs in the mouth or throat every 6 (six) hours as needed for mouth pain. Mixed with [facility] mouthwash swish and swallow., Disp: 100 mL, Rfl: 0   tiotropium (SPIRIVA) 18 MCG inhalation capsule, Place 18 mcg into inhaler and inhale daily., Disp: , Rfl:   Past Medical History: No past medical history on file.  Tobacco Use: History  Smoking Status   Former Smoker  Smokeless Tobacco   Never Used    Labs: Recent Review Flowsheet Data    There is no flowsheet data to display.       ADL UCSD:     Pulmonary Assessment Scores    Row Name 08/03/16 1132         ADL UCSD   ADL Phase Entry     SOB Score total 13     Rest 0     Walk 0     Stairs 3     Bath 0     Dress 0     Shop 0        Pulmonary Function Assessment:     Pulmonary Function Assessment - 08/03/16 1131      Pulmonary Function Tests   RV% 50 %   DLCO% 70 %     Initial Spirometry Results   FVC% 80 %   FEV1% 64 %   FEV1/FVC Ratio 63   Comments test date 11/04/15     Post Bronchodilator Spirometry Results   FVC% 89 %   FEV1% 67 %   FEV1/FVC Ratio 60     Breath   Bilateral Breath Sounds  Clear   Shortness of Breath Yes      Exercise Target Goals:    Exercise Program Goal: Individual exercise prescription set with THRR, safety & activity barriers. Participant demonstrates ability to understand and report RPE using BORG scale, to self-measure pulse accurately, and to acknowledge the importance of the exercise prescription.  Exercise Prescription Goal: Starting with aerobic activity 30 plus minutes a day, 3 days per week for initial exercise prescription. Provide home exercise prescription and guidelines that participant acknowledges understanding prior to discharge.  Activity Barriers & Risk Stratification:   6 Minute Walk:     6 Minute Walk    Row Name 08/03/16 1127         6 Minute Walk   Distance 1600 feet     Walk Time 6 minutes     # of Rest Breaks 0     MPH 3.03     METS 3.89     RPE 13     Perceived Dyspnea  2     VO2 Peak 13.6  Symptoms No     Resting HR 67 bpm     Resting BP 120/70     Max Ex. HR 112 bpm     Max Ex. BP 142/62       Interval HR   3 Minute HR 105     6 Minute HR 112     Interval Heart Rate? Yes       Interval Oxygen   Interval Oxygen? Yes     Baseline Oxygen Saturation % 96 %     3 Minute Oxygen Saturation % 91 %     6 Minute Oxygen Saturation % 89 %       Oxygen Initial Assessment:     Oxygen Initial Assessment - 08/03/16 1136      Home Oxygen   Home Oxygen Device None      Oxygen Re-Evaluation:     Oxygen Re-Evaluation    Row Name 09/20/16 1428             Program Oxygen Prescription   Program Oxygen Prescription None         Home Oxygen   Home Oxygen Device None          Oxygen Discharge (Final Oxygen Re-Evaluation):     Oxygen Re-Evaluation - 09/20/16 1428      Program Oxygen Prescription   Program Oxygen Prescription None     Home Oxygen   Home Oxygen Device None      Initial Exercise Prescription:     Initial Exercise Prescription - 08/03/16 1100      Date of Initial  Exercise RX and Referring Provider   Date 08/03/16   Referring Provider Raul Del     Treadmill   MPH 2.8   Grade 1   Minutes 15   METs 3.53     Recumbant Bike   Level 3   RPM 60   Watts 35   Minutes 15   METs 3.5     Elliptical   Level 1   Speed 3   Minutes 15     Prescription Details   Frequency (times per week) 3   Duration Progress to 45 minutes of aerobic exercise without signs/symptoms of physical distress     Intensity   THRR 40-80% of Max Heartrate 99-132   Ratings of Perceived Exertion 11-15     Resistance Training   Training Prescription Yes   Weight 3   Reps 10-15      Perform Capillary Blood Glucose checks as needed.  Exercise Prescription Changes:     Exercise Prescription Changes    Row Name 08/09/16 1300 08/24/16 1400 09/08/16 1400 09/22/16 1500       Response to Exercise   Blood Pressure (Admit) 110/62 128/80 128/68 120/60    Blood Pressure (Exercise) 150/66 136/70 128/60 152/70    Blood Pressure (Exit) 110/68 126/74 112/60 118/60    Heart Rate (Admit) 85 bpm 79 bpm  -- 91 bpm    Heart Rate (Exercise) 133 bpm 114 bpm 128 bpm 124 bpm    Heart Rate (Exit) 102 bpm 108 bpm 98 bpm 90 bpm    Oxygen Saturation (Admit) 97 % 94 % 94 % 92 %    Oxygen Saturation (Exercise) 92 % 92 % 90 % 89 %    Oxygen Saturation (Exit) 92 % 94 % 93 % 92 %    Rating of Perceived Exertion (Exercise) _0 Perceived Dyspnea (Exercise) _1 2  Symptoms  -- none none none    Duration Progress to 45 minutes of aerobic exercise without signs/symptoms of physical distress Continue with 45 min of aerobic exercise without signs/symptoms of physical distress. Continue with 45 min of aerobic exercise without signs/symptoms of physical distress. Continue with 45 min of aerobic exercise without signs/symptoms of physical distress.    Intensity THRR unchanged THRR unchanged THRR unchanged THRR unchanged      Progression   Progression Continue to progress workloads to  maintain intensity without signs/symptoms of physical distress. Continue to progress workloads to maintain intensity without signs/symptoms of physical distress. Continue to progress workloads to maintain intensity without signs/symptoms of physical distress. Continue to progress workloads to maintain intensity without signs/symptoms of physical distress.    Average METs  -- 3.61 3.37 3.74      Resistance Training   Training Prescription Yes Yes Yes Yes    Weight _0 lbs 4 lbs    Reps 10-15 10-15 10-15 10-15      Interval Training   Interval Training  -- No No No      Treadmill   MPH 2.8 2.8 2.8 2.8    Grade _1 Minutes _2 METs  -- 3.53 3.53 3.53      Recumbant Bike   Level _3 RPM 80 60 65 67    Watts  -- 30 23 37    Minutes _4 METs  -- 3.53 3.21 3.57      Elliptical   Level _5 Speed  -- 3.1 3.7  --    Minutes _6 Home Exercise Plan   Plans to continue exercise at  --  -- [employer] (comment)  walking track at [place] (comment[place]    Frequency  --  -- Add 2 additional days to program exercise sessions. Add 2 additional days to program exercise sessions.    Initial Home Exercises Provided  --  -- 08/20/16 08/20/16       Exercise Comments:     Exercise Comments    Row Name 08/10/16 1526           Exercise Comments First full day of exercise!  Patient was oriented to gym and equipment including functions, settings, policies, and procedures.  Patient's individual exercise prescription and treatment plan were reviewed.  All starting workloads were established based on the results of the 6 minute walk test done at initial orientation visit.  The plan for exercise progression was also introduced and progression will be customized based on patient's performance and goals.          Exercise Goals and Review:     Exercise Goals    Row Name 08/03/16 1133              Exercise Goals   Increase Physical Activity Yes       Intervention Provide advice, education, support and counseling about physical activity/exercise needs.;Develop an individualized exercise prescription for aerobic and resistive training based on initial evaluation findings, risk stratification, comorbidities and participant's personal goals.       Expected Outcomes Achievement of increased cardiorespiratory fitness and enhanced flexibility, muscular endurance and strength shown through measurements of functional capacity and personal statement of participant.  Increase Strength and Stamina Yes       Intervention Provide advice, education, support and counseling about physical activity/exercise needs.;Develop an individualized exercise prescription for aerobic and resistive training based on initial evaluation findings, risk stratification, comorbidities and participant's personal goals.       Expected Outcomes Achievement of increased cardiorespiratory fitness and enhanced flexibility, muscular endurance and strength shown through measurements of functional capacity and personal statement of participant.          Exercise Goals Re-Evaluation :     Exercise Goals Re-Evaluation    Row Name 08/10/16 1525 08/24/16 1408 09/08/16 1442 09/22/16 1531       Exercise Goal Re-Evaluation   Exercise Goals Review Increase Physical Activity;Increase Strenth and Stamina Increase Physical Activity;Increase Strenth and Stamina Increase Physical Activity;Increase Strenth and Stamina Increase Physical Activity;Increase Strenth and Stamina    Comments Oscar Sanchez has completed his first full day of exercise!  We will work with him on progression. Oscar Sanchez has been doing well in rehab.  He attends on Wednesdays and Fridays.  He is now up to 56 Watts on the recumbent bike.  We will continue to work on his progression. Oscar Sanchez continues to do well in rehab. He even came on Monday knowing he was going to miss a  day.  He is now up to 4 lbs weights.  We will continue to monitor his progression. Oscar Sanchez has been doing well in rehab.  He is nearing his mid program 6MWT.  He has continue to make improvements.  He is now up to level 4 on the recumbent bike.  We will continue to monitor his progress.    Expected Outcomes Short and Long: Come to class regularly to improve his strength and stamina. Short: Begin to increase workloads.  Long: Build more stamina and increase overall physical activity. Short: Move up workload on treadmill and bike.  Long: Make exercise part of routine. Short: Move up treadmill.  Long: Continue to work on improving strength and stamina.        Discharge Exercise Prescription (Final Exercise Prescription Changes):     Exercise Prescription Changes - 09/22/16 1500      Response to Exercise   Blood Pressure (Admit) 120/60   Blood Pressure (Exercise) 152/70   Blood Pressure (Exit) 118/60   Heart Rate (Admit) 91 bpm   Heart Rate (Exercise) 124 bpm   Heart Rate (Exit) 90 bpm   Oxygen Saturation (Admit) 92 %   Oxygen Saturation (Exercise) 89 %   Oxygen Saturation (Exit) 92 %   Rating of Perceived Exertion (Exercise) 13   Perceived Dyspnea (Exercise) 2   Symptoms none   Duration Continue with 45 min of aerobic exercise without signs/symptoms of physical distress.   Intensity THRR unchanged     Progression   Progression Continue to progress workloads to maintain intensity without signs/symptoms of physical distress.   Average METs 3.74     Resistance Training   Training Prescription Yes   Weight 4 lbs   Reps 10-15     Interval Training   Interval Training No     Treadmill   MPH 2.8   Grade 1   Minutes 15   METs 3.53     Recumbant Bike   Level 4   RPM 67   Watts 37   Minutes 15   METs 3.57     Elliptical   Level 1   Minutes 15     Home Exercise Plan   Plans to  continue exercise at [employer] (comment)  [place]   Frequency Add 2  additional days to program exercise sessions.   Initial Home Exercises Provided 08/20/16      Nutrition:  Target Goals: Understanding of nutrition guidelines, daily intake of sodium <159m, cholesterol <2037m calories 30% from fat and 7% or less from saturated fats, daily to have 5 or more servings of fruits and vegetables.  Biometrics:     Pre Biometrics - 08/03/16 1126      Pre Biometrics   Height _0  (1.727 m)   Weight 148 lb 1.6 oz (67.2 kg)   Waist Circumference 38 inches   Hip Circumference 40.75 inches   Waist to Hip Ratio 0.93 %   BMI (Calculated) 22.6       Nutrition Therapy Plan and Nutrition Goals:   Nutrition Discharge: Rate Your Plate Scores:   Nutrition Goals Re-Evaluation:   Nutrition Goals Discharge (Final Nutrition Goals Re-Evaluation):   Psychosocial: Target Goals: Acknowledge presence or absence of significant depression and/or stress, maximize coping skills, provide positive support system. Participant is able to verbalize types and ability to use techniques and skills needed for reducing stress and depression.   Initial Review & Psychosocial Screening:     Initial Psych Review & Screening - 08/03/16 11[employer] AFBYes   Comments Mr WiCapellias good support from his church family. He is divorced and no children. 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 At age [age]he found God and has been very blessed. 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 He is looking forward to [place] improve his shortness of breath.and increased stamina.     Barriers   Psychosocial barriers to participate in program The patient should benefit from training in stress management and relaxation.     Screening Interventions   Interventions Encouraged to exercise      Quality of Life Scores:     Quality of Life - 08/03/16 1142      Quality of Life  Scores   Health/Function Pre 20.67 %   Socioeconomic Pre 20.71 %   Psych/Spiritual Pre 21 %   Family Pre 20.33 %   GLOBAL Pre 20.72 %      PHQ-9: Recent Review Flowsheet Data    [facility]   Decreased Interest 0   Down, Depressed, Hopeless 0   PHQ - 2 Score 0   Altered sleeping 3   Tired, decreased energy 1   Change in appetite 0   Feeling bad or failure about yourself  0   Trouble concentrating 0   Moving slowly or fidgety/restless 0   Suicidal thoughts 0   PHQ-9 Score 4   Difficult doing work/chores Not difficult at all     Interpretation of Total Score  Total Score Depression Severity:  1-4 = Minimal depression, 5-9 = Mild depression, 10-14 = Moderate depression, 15-19 = Moderately severe depression, 20-27 = Severe depression   Psychosocial Evaluation and Intervention:     Psychosocial Evaluation - 08/09/16 1242      Psychosocial Evaluation & Interventions   Interventions Encouraged to exercise with the program and follow exercise prescription   Comments Counselor met with mr. WiReymundo PollTKonrad Dolorestoday for initial psychosocial evaluation.  He is a [age]ear old who has been diagnosed with COPD.  He lives alone but has several sisters who live locally and he is actively involved in his local church.  Oscar Sanchez reports he does not sleep well but he admits to "drinking a lot of coffee."  Counselor encouraged him to switch to decaf after noon and he agreed to try this.  He denies a history of depression or anxiety or current symptoms.  He states he is typically in a positive mood and he has minimal stress in his life - and relies on his faith for any stress that arises.  He has goals to increase his education about his disease and ways to improve his health.  He desires to increase his stamina and strength while in this program.  Staff will be following with him throughout the course of this program.    Expected Outcomes Oscar Sanchez will exercise consistently to achieve his  stated goals.  He will benefit from the educational components as well.     Continue Psychosocial Services  Follow up required by staff      Psychosocial Re-Evaluation:     Psychosocial Re-Evaluation    Oscar Sanchez Name 09/20/16 1431 09/20/16 1437           Psychosocial Re-Evaluation   Current issues with None Identified  --      Comments  -- .Sollie psychosocial assessment reveals no barriers at this time to participation in Pulmonary Rehab.  he  has good family and friend support that encourages Oscar Sanchez to participate in [place] and progress with His goals.  Micha concerns are monitored, but he  has acknowledge that attending the program has helped to maintain quality life with improved mobility, self-care, and emotional and financial stability.  Oscar Sanchez is commended for regular attendance and self-motivation to improve His pulmonary disease management.      Expected Outcomes  -- Continue to exercise and maintain a stress free home environment.          Psychosocial Discharge (Final Psychosocial Re-Evaluation):     Psychosocial Re-Evaluation - 09/20/16 1437      Psychosocial Re-Evaluation   Comments .Oscar Sanchez psychosocial assessment reveals no barriers at this time to participation in Pulmonary Rehab.  he  has good family and friend support that encourages Oscar Sanchez to participate in [place] and progress with His goals.  Oscar Sanchez concerns are monitored, but he  has acknowledge that attending the program has helped to maintain quality life with improved mobility, self-care, and emotional and financial stability.  Oscar Sanchez is commended for regular attendance and self-motivation to improve His pulmonary disease management.   Expected Outcomes Continue to exercise and maintain a stress free home environment.       Education: Education Goals: Education classes will be provided on a weekly basis, covering required topics. Participant will state understanding/return demonstration of topics  presented.  Learning Barriers/Preferences:     Learning Barriers/Preferences - 08/03/16 1131      Learning Barriers/Preferences   Learning Barriers None   Learning Preferences None      Education Topics: Initial Evaluation Education: - Verbal, written and demonstration of respiratory meds, RPE/PD scales, oximetry and breathing techniques. Instruction on use of nebulizers and MDIs: cleaning and proper use, rinsing mouth with steroid doses and importance of monitoring MDI activations.   Pulmonary Rehab from 09/15/2016 in [facility] Cardiac and Pulmonary Rehab  Date  08/03/16  Educator  LB  Instruction Review Code  2- meets goals/outcomes      General Nutrition Guidelines/Fats and Fiber: -Group instruction provided by verbal, written material, models  and posters to present the general guidelines for heart healthy nutrition. Gives an explanation and review of dietary fats and fiber.   Controlling Sodium/Reading Food Labels: -Group verbal and written material supporting the discussion of sodium use in heart healthy nutrition. Review and explanation with models, verbal and written materials for utilization of the food label.   Pulmonary Rehab from 09/15/2016 in [facility] Cardiac and Pulmonary Rehab  Date  08/09/16  Educator  CR  Instruction Review Code  2- meets goals/outcomes      Exercise Physiology & Risk Factors: - Group verbal and written instruction with models to review the exercise physiology of the cardiovascular system and associated critical values. Details cardiovascular disease risk factors and the goals associated with each risk factor.   Aerobic Exercise & Resistance Training: - Gives group verbal and written discussion on the health impact of inactivity. On the components of aerobic and resistive training programs and the benefits of this training and how to safely progress through these programs.   Flexibility, Balance, General Exercise Guidelines: - Provides group verbal  and written instruction on the benefits of flexibility and balance training programs. Provides general exercise guidelines with specific guidelines to those with heart or lung disease. Demonstration and skill practice provided.   Pulmonary Rehab from 09/15/2016 in [facility] Cardiac and Pulmonary Rehab  Date  09/03/16  Educator  AS  Instruction Review Code  2- meets goals/outcomes      Stress Management: - Provides group verbal and written instruction about the health risks of elevated stress, cause of high stress, and healthy ways to reduce stress.   Depression: - Provides group verbal and written instruction on the correlation between heart/lung disease and depressed mood, treatment options, and the stigmas associated with seeking treatment.   Pulmonary Rehab from 09/15/2016 in [facility] Cardiac and Pulmonary Rehab  Date  09/01/16  Educator  [facility]  Instruction Review Code  2- meets goals/outcomes      Exercise & Equipment Safety: - Individual verbal instruction and demonstration of equipment use and safety with use of the equipment.   Pulmonary Rehab from 09/15/2016 in [facility] Cardiac and Pulmonary Rehab  Date  08/09/16  Educator  LB  Instruction Review Code  2- meets goals/outcomes      Infection Prevention: - Provides verbal and written material to individual with discussion of infection control including proper hand washing and proper equipment cleaning during exercise session.   Pulmonary Rehab from 09/15/2016 in [facility] Cardiac and Pulmonary Rehab  Date  08/03/16  Educator  LB  Instruction Review Code  2- meets goals/outcomes      Falls Prevention: - Provides verbal and written material to individual with discussion of falls prevention and safety.   Pulmonary Rehab from 09/15/2016 in [facility] Cardiac and Pulmonary Rehab  Date  08/03/16  Educator  LB  Instruction Review Code  2- meets goals/outcomes      Diabetes: - Individual verbal and written instruction to review signs/symptoms of  diabetes, desired ranges of glucose level fasting, after meals and with exercise. Advice that pre and post exercise glucose checks will be done for 3 sessions at entry of program.   Chronic Lung Diseases: - Group verbal and written instruction to review new updates, new respiratory medications, new advancements in procedures and treatments. Provide informative websites and "800" numbers of self-education.   Lung Procedures: - Group verbal and written instruction to describe testing methods done to diagnose lung disease. Review the outcome of test results. Describe the treatment choices:  Pulmonary Function Tests, ABGs and oximetry.   Energy Conservation: - Provide group verbal and written instruction for methods to conserve energy, plan and organize activities. Instruct on pacing techniques, use of adaptive equipment and posture/positioning to relieve shortness of breath.   Pulmonary Rehab from 09/15/2016 in [facility] Cardiac and Pulmonary Rehab  Date  09/15/16  Educator  [facility]  Instruction Review Code  2- meets goals/outcomes      Triggers: - Group verbal and written instruction to review types of environmental controls: home humidity, furnaces, filters, dust mite/pet prevention, HEPA vacuums. To discuss weather changes, air quality and the benefits of nasal washing.   Exacerbations: - Group verbal and written instruction to provide: warning signs, infection symptoms, calling MD promptly, preventive modes, and value of vaccinations. Review: effective airway clearance, coughing and/or vibration techniques. Create an Sports administrator.   Oxygen: - Individual and group verbal and written instruction on oxygen therapy. Includes supplement oxygen, available portable oxygen systems, continuous and intermittent flow rates, oxygen safety, concentrators, and Medicare reimbursement for oxygen.   Respiratory Medications: - Group verbal and written instruction to review medications for lung disease. Drug  class, frequency, complications, importance of spacers, rinsing mouth after steroid MDI's, and proper cleaning methods for nebulizers.   Pulmonary Rehab from 09/15/2016 in [facility] Cardiac and Pulmonary Rehab  Date  08/03/16  Educator  LB  Instruction Review Code  2- meets goals/outcomes      AED/CPR: - Group verbal and written instruction with the use of models to demonstrate the basic use of the AED with the basic ABC's of resuscitation.   Breathing Retraining: - Provides individuals verbal and written instruction on purpose, frequency, and proper technique of diaphragmatic breathing and pursed-lipped breathing. Applies individual practice skills.   Pulmonary Rehab from 09/15/2016 in [facility] Cardiac and Pulmonary Rehab  Date  08/03/16  Educator  LB  Instruction Review Code  2- meets goals/outcomes      Anatomy and Physiology of the Lungs: - Group verbal and written instruction with the use of models to provide basic lung anatomy and physiology related to function, structure and complications of lung disease.   Pulmonary Rehab from 09/15/2016 in [facility] Card[facility] Pul[facility]  Date  09/08/16  Educator  LB  Instruction Review Code  2- meets goals/outcomes      Heart Failure: - Group verbal and written instruction on the basics of heart failure: signs/symptoms, treatments, explanation of ejection fraction, enlarged heart and cardiomyopathy.   Pulmonary Rehab from 09/15/2016 in [facility] Re[facility]  Date  08/20/16  Educator  CE  Instruction Review Code  2- meets goals/outcomes      Sleep Apnea: - Individual verbal and written instruction to review Obstructive Sleep Apnea. Review of risk factors, methods for diagnosing and types of masks and machines for OSA.   Anxiety: - Provides group, verbal and written instruction on the correlation between heart/lung disease and anxiety, treatment options, and management of anxiety.   Relaxation: - Provides group, verbal and  written instruction about the benefits of relaxation for patients with heart/lung disease. Also provides patients with examples of relaxation techniques.   Knowledge Questionnaire Score:     Knowledge Questionnaire Score - 08/03/16 1131      Knowledge Questionnaire Score   Pre Score 6/10       Core Components/Risk Factors/Patient Goals at Admission:     Personal Goals and Risk Factors at Admission - 08/03/16 1135      Core Components/Risk Factors/Patient Goals on  Admission   Improve shortness of breath with ADL's Yes   Intervention Provide education, individualized exercise plan and daily activity instruction to help decrease symptoms of SOB with activities of daily living.   Expected Outcomes Short Term: Achieves a reduction of symptoms when performing activities of daily living.   Develop more efficient breathing techniques such as purse lipped breathing and diaphragmatic breathing; and practicing self-pacing with activity Yes   Intervention Provide education, demonstration and support about specific breathing techniuqes utilized for more efficient breathing. Include techniques such as pursed lipped breathing, diaphragmatic breathing and self-pacing activity.   Expected Outcomes Short Term: Participant will be able to demonstrate and use breathing techniques as needed throughout daily activities.   Increase knowledge of respiratory medications and ability to use respiratory devices properly  Yes  Spiriva, rarely uses Proventil   Intervention Provide education and demonstration as needed of appropriate use of medications, inhalers, and oxygen therapy.   Expected Outcomes Short Term: Achieves understanding of medications use. Understands that oxygen is a medication prescribed by physician. Demonstrates appropriate use of inhaler and oxygen therapy.      Core Components/Risk Factors/Patient Goals Review:      Goals and Risk Factor Review    Row Name 09/20/16 1419              Core Components/Risk Factors/Patient Goals Review   Personal Goals Review Improve shortness of breath with ADL's;Develop more efficient breathing techniques such as purse lipped breathing and diaphragmatic breathing and practicing self-pacing with activity.;Increase knowledge of respiratory medications and ability to use respiratory devices properly.       Review Patients states shortness of breath has improved slighty since his time in the program. Patient enjoys the program and if determind to get stonger and work on his exercises.  Oscar Sanchez likes doing yard work and wants to be able to manage church events without getting short of breath.       Expected Outcomes Continue exercising in [facility], Have patient use PLB with his activities at home and with his church to keep improving his self management of COPD.          Core Components/Risk Factors/Patient Goals at Discharge (Final Review):      Goals and Risk Factor Review - 09/20/16 1419      Core Components/Risk Factors/Patient Goals Review   Personal Goals Review Improve shortness of breath with ADL's;Develop more efficient breathing techniques such as purse lipped breathing and diaphragmatic breathing and practicing self-pacing with activity.;Increase knowledge of respiratory medications and ability to use respiratory devices properly.   Review Patients states shortness of breath has improved slighty since his time in the program. Patient enjoys the program and if determind to get stonger and work on his exercises.  Oscar Sanchez likes doing yard work and wants to be able to manage church events without getting short of breath.   Expected Outcomes Continue exercising in [facility], Have patient use PLB with his activities at home and with his church to keep improving his self management of COPD.      ITP Comments:     ITP Comments    Row Name 08/03/16 1135 08/27/16 1306 09/06/16 0850 10/04/16 0812     ITP Comments Face to face meeting with  Dr Emily Filbert, [place] Director today. Took pt to visit medical director today for face to face meeting.  Dr. Caryl Comes signed and saw pt for Dr. Sabra Heck who was out. 30 day note review by Dr Elta Guadeloupe  Sabra Heck, Medical Director of [place] 30 day note review with Dr Emily Filbert, Medical Director of [facility]       Comments: **30 day note review with Dr Emily Filbert, Medical Director of [facility]*

## 2016-10-05 ENCOUNTER — Encounter: Payer: Self-pay | Admitting: *Deleted

## 2016-10-05 DIAGNOSIS — J449 Chronic obstructive pulmonary disease, unspecified: Secondary | ICD-10-CM

## 2016-10-19 ENCOUNTER — Encounter: Payer: Self-pay | Admitting: *Deleted

## 2016-10-19 DIAGNOSIS — J449 Chronic obstructive pulmonary disease, unspecified: Secondary | ICD-10-CM

## 2016-10-26 ENCOUNTER — Telehealth: Payer: Self-pay

## 2016-10-26 NOTE — Progress Notes (Signed)
Discharge Summary  Patient Details  Name: Oscar Sanchez MRN: [id number] Date of Birth: [birth year] Referring Provider:     Pulmonary Rehab from 08/03/2016 in [facility] Cardiac and Pulmonary Rehab  Referring Provider  Raul Del       Number of Visits: 14/36  Reason for Discharge:  Early Exit:  Personal  Smoking History:  History  Smoking Status  . Former Smoker  Smokeless Tobacco  . Never Used    Diagnosis:  Chronic obstructive pulmonary disease, unspecified COPD type ([place])  ADL UCSD:     Pulmonary Assessment Scores    Row Name 08/03/16 1132         ADL UCSD   ADL Phase Entry     SOB Score total 13     Rest 0     Walk 0     Stairs 3     Bath 0     Dress 0     Shop 0        Initial Exercise Prescription:     Initial Exercise Prescription - 08/03/16 1100      Date of Initial Exercise RX and Referring Provider   Date 08/03/16   Referring Provider Raul Del     Treadmill   MPH 2.8   Grade 1   Minutes 15   METs 3.53     Recumbant Bike   Level 3   RPM 60   Watts 35   Minutes 15   METs 3.5     Elliptical   Level 1   Speed 3   Minutes 15     Prescription Details   Frequency (times per week) 3   Duration Progress to 45 minutes of aerobic exercise without signs/symptoms of physical distress     Intensity   THRR 40-80% of Max Heartrate 99-132   Ratings of Perceived Exertion 11-15     Resistance Training   Training Prescription Yes   Weight 3   Reps 10-15      Discharge Exercise Prescription (Final Exercise Prescription Changes):     Exercise Prescription Changes - 09/22/16 1500      Response to Exercise   Blood Pressure (Admit) 120/60   Blood Pressure (Exercise) 152/70   Blood Pressure (Exit) 118/60   Heart Rate (Admit) 91 bpm   Heart Rate (Exercise) 124 bpm   Heart Rate (Exit) 90 bpm   Oxygen Saturation (Admit) 92 %   Oxygen Saturation (Exercise) 89 %   Oxygen Saturation (Exit) 92 %   Rating of Perceived Exertion (Exercise) 13   Perceived Dyspnea (Exercise) 2   Symptoms none   Duration Continue with 45 min of aerobic exercise without signs/symptoms of physical distress.   Intensity THRR unchanged     Progression   Progression Continue to progress workloads to maintain intensity without signs/symptoms of physical distress.   Average METs 3.74     Resistance Training   Training Prescription Yes   Weight 4 lbs   Reps 10-15     Interval Training   Interval Training No     Treadmill   MPH 2.8   Grade 1   Minutes 15   METs 3.53     Recumbant Bike   Level 4   RPM 67   Watts 37   Minutes 15   METs 3.57     Elliptical   Level 1   Minutes 15     Home Exercise Plan   Plans to continue exercise at [employer] (comment)  [place]   Frequency Add 2 additional days to program exercise sessions.   Initial Home Exercises Provided 08/20/16      Functional Capacity:     6 Minute Walk    Row Name 08/03/16 1127         6 Minute Walk   Distance 1600 feet     Walk Time 6 minutes     # of Rest Breaks 0     MPH 3.03     METS 3.89     RPE 13     Perceived Dyspnea  2     VO2 Peak 13.6     Symptoms No     Resting HR 67 bpm     Resting BP 120/70     Max Ex. HR 112 bpm     Max Ex. BP 142/62       Interval HR   3 Minute HR 105     6 Minute HR 112     Interval Heart Rate? Yes       Interval Oxygen   Interval Oxygen? Yes     Baseline Oxygen Saturation % 96 %     3 Minute Oxygen Saturation % 91 %     6 Minute Oxygen Saturation % 89 %        Psychological, QOL, Others - Outcomes: PHQ 2/9: Depression screen PHQ 2/9 08/03/2016  Decreased Interest 0  Down, Depressed, Hopeless 0  PHQ - 2 Score 0  Altered sleeping 3  Tired, decreased energy 1  Change in appetite 0  Feeling bad or failure about yourself  0  Trouble concentrating 0  Moving slowly or fidgety/restless 0  Suicidal thoughts 0  PHQ-9 Score 4  Difficult doing work/chores Not difficult at all    Quality of  Life:     Quality of Life - 08/03/16 1142      Quality of Life Scores   Health/Function Pre 20.67 %   Socioeconomic Pre 20.71 %   Psych/Spiritual Pre 21 %   Family Pre 20.33 %   GLOBAL Pre 20.72 %      Personal Goals: Goals established at orientation with interventions provided to work toward goal.     Personal Goals and Risk Factors at Admission - 08/03/16 1135      Core Components/Risk Factors/Patient Goals on Admission   Improve shortness of breath with ADL's Yes   Intervention Provide education, individualized exercise plan and daily activity instruction to help decrease symptoms of SOB with activities of daily living.   Expected Outcomes Short Term: Achieves a reduction of symptoms when performing activities of daily living.   Develop more efficient breathing techniques such as purse lipped breathing and diaphragmatic breathing; and practicing self-pacing with activity Yes   Intervention Provide education, demonstration and support about specific breathing techniuqes utilized for more efficient breathing. Include techniques such as pursed lipped breathing, diaphragmatic breathing and self-pacing activity.   Expected Outcomes Short Term: Participant will be able to demonstrate and use breathing techniques as needed throughout daily activities.   Increase knowledge of respiratory medications and ability to use respiratory devices properly  Yes  Spiriva, rarely uses Proventil   Intervention Provide education and demonstration as needed of appropriate use of medications, inhalers, and oxygen therapy.   Expected Outcomes Short Term: Achieves understanding of medications use. Understands that oxygen is a medication prescribed by physician. Demonstrates appropriate use of inhaler and oxygen therapy.       Personal Goals Discharge:  Goals and Risk Factor Review    Row Name 09/20/16 1419             Core Components/Risk Factors/Patient Goals Review   Personal Goals Review  Improve shortness of breath with ADL's;Develop more efficient breathing techniques such as purse lipped breathing and diaphragmatic breathing and practicing self-pacing with activity.;Increase knowledge of respiratory medications and ability to use respiratory devices properly.       Review Patients states shortness of breath has improved slighty since his time in the program. Patient enjoys the program and if determind to get stonger and work on his exercises.  Mr. Boyd likes doing yard work and wants to be able to manage church events without getting short of breath.       Expected Outcomes Continue exercising in [facility], Have patient use PLB with his activities at home and with his church to keep improving his self management of COPD.          Nutrition & Weight - Outcomes:     Pre Biometrics - 08/03/16 1126      Pre Biometrics   Height 5\' 8"  (1.727 m)   Weight 148 lb 1.6 oz (67.2 kg)   Waist Circumference 38 inches   Hip Circumference 40.75 inches   Waist to Hip Ratio 0.93 %   BMI (Calculated) 22.6       Nutrition:   Nutrition Discharge:   Education Questionnaire Score:     Knowledge Questionnaire Score - 08/03/16 1131      Knowledge Questionnaire Score   Pre Score 6/10      Goals reviewed with patient; copy given to patient.

## 2016-10-26 NOTE — Progress Notes (Signed)
Pulmonary Individual Treatment Plan  Patient Details  Name: Oscar Sanchez MRN: [id number] Date of Birth: 1945-12-08 Referring Provider:     Pulmonary Rehab from 08/03/2016 in [facility] Card[facility] and Pulmonary Rehab  Referring Provider  Raul Del      Initial Encounter Date:    Pulmonary Rehab from 08/03/2016 in [facility] Cardiac and Pulmonary Rehab  Date  08/03/16  Referring Provider  Raul Del      Visit Diagnosis: Chronic obstructive pulmonary disease, unspecified COPD type ([place])  Patient's Home Medications on Admission:  Current Outpatient Prescriptions:  .  Diphenhyd-Hydrocort-Nystatin (FIRST-DUKES MOUTHWASH) SUSP, Use as directed 10 mLs in the mouth or throat 4 (four) times daily. Mixed with 5 mL of viscous lidocaine for swish and swallow, Disp: 237 mL, Rfl: 0 .  lidocaine (XYLOCAINE) 2 % solution, Use as directed 5 mLs in the mouth or throat every 6 (six) hours as needed for mouth pain. Mixed with [facility] mouthwash swish and swallow., Disp: 100 mL, Rfl: 0 .  tiotropium (SPIRIVA) 18 MCG inhalation capsule, Place 18 mcg into inhaler and inhale daily., Disp: , Rfl:   Past Medical History: No past medical history on file.  Tobacco Use: History  Smoking Status  . Former Smoker  Smokeless Tobacco  . Never Used    Labs: Recent Review Flowsheet Data    There is no flowsheet data to display.       ADL UCSD:     Pulmonary Assessment Scores    Row Name 08/03/16 1132         ADL UCSD   ADL Phase Entry     SOB Score total 13     Rest 0     Walk 0     Stairs 3     Bath 0     Dress 0     Shop 0        Pulmonary Function Assessment:     Pulmonary Function Assessment - 08/03/16 1131      Pulmonary Function Tests   RV% 50 %   DLCO% 70 %     Initial Spirometry Results   FVC% 80 %   FEV1% 64 %   FEV1/FVC Ratio 63   Comments test date 11/04/15     Post Bronchodilator Spirometry Results   FVC% 89 %   FEV1% 67 %   FEV1/FVC Ratio 60     Breath   Bilateral Breath Sounds  Clear   Shortness of Breath Yes      Exercise Target Goals:    Exercise Program Goal: Individual exercise prescription set with THRR, safety & activity barriers. Participant demonstrates ability to understand and report RPE using BORG scale, to self-measure pulse accurately, and to acknowledge the importance of the exercise prescription.  Exercise Prescription Goal: Starting with aerobic activity 30 plus minutes a day, 3 days per week for initial exercise prescription. Provide home exercise prescription and guidelines that participant acknowledges understanding prior to discharge.  Activity Barriers & Risk Stratification:   6 Minute Walk:     6 Minute Walk    Row Name 08/03/16 1127         6 Minute Walk   Distance 1600 feet     Walk Time 6 minutes     # of Rest Breaks 0     MPH 3.03     METS 3.89     RPE 13     Perceived Dyspnea  2     VO2 Peak 13.6  Symptoms No     Resting HR 67 bpm     Resting BP 120/70     Max Ex. HR 112 bpm     Max Ex. BP 142/62       Interval HR   3 Minute HR 105     6 Minute HR 112     Interval Heart Rate? Yes       Interval Oxygen   Interval Oxygen? Yes     Baseline Oxygen Saturation % 96 %     3 Minute Oxygen Saturation % 91 %     6 Minute Oxygen Saturation % 89 %       Oxygen Initial Assessment:     Oxygen Initial Assessment - 08/03/16 1136      Home Oxygen   Home Oxygen Device None      Oxygen Re-Evaluation:     Oxygen Re-Evaluation    Row Name 09/20/16 1428             Program Oxygen Prescription   Program Oxygen Prescription None         Home Oxygen   Home Oxygen Device None          Oxygen Discharge (Final Oxygen Re-Evaluation):     Oxygen Re-Evaluation - 09/20/16 1428      Program Oxygen Prescription   Program Oxygen Prescription None     Home Oxygen   Home Oxygen Device None      Initial Exercise Prescription:     Initial Exercise Prescription - 08/03/16 1100      Date of Initial  Exercise RX and Referring Provider   Date 08/03/16   Referring Provider Raul Del     Treadmill   MPH 2.8   Grade 1   Minutes 15   METs 3.53     Recumbant Bike   Level 3   RPM 60   Watts 35   Minutes 15   METs 3.5     Elliptical   Level 1   Speed 3   Minutes 15     Prescription Details   Frequency (times per week) 3   Duration Progress to 45 minutes of aerobic exercise without signs/symptoms of physical distress     Intensity   THRR 40-80% of Max Heartrate 99-132   Ratings of Perceived Exertion 11-15     Resistance Training   Training Prescription Yes   Weight 3   Reps 10-15      Perform Capillary Blood Glucose checks as needed.  Exercise Prescription Changes:     Exercise Prescription Changes    Row Name 08/09/16 1300 08/24/16 1400 09/08/16 1400 09/22/16 1500       Response to Exercise   Blood Pressure (Admit) 110/62 128/80 128/68 120/60    Blood Pressure (Exercise) 150/66 136/70 128/60 152/70    Blood Pressure (Exit) 110/68 126/74 112/60 118/60    Heart Rate (Admit) 85 bpm 79 bpm  - 91 bpm    Heart Rate (Exercise) 133 bpm 114 bpm 128 bpm 124 bpm    Heart Rate (Exit) 102 bpm 108 bpm 98 bpm 90 bpm    Oxygen Saturation (Admit) 97 % 94 % 94 % 92 %    Oxygen Saturation (Exercise) 92 % 92 % 90 % 89 %    Oxygen Saturation (Exit) 92 % 94 % 93 % 92 %    Rating of Perceived Exertion (Exercise) '13 13 13 13    ' Perceived Dyspnea (Exercise) '2 2 2 ' 2  Symptoms  - none none none    Duration Progress to 45 minutes of aerobic exercise without signs/symptoms of physical distress Continue with 45 min of aerobic exercise without signs/symptoms of physical distress. Continue with 45 min of aerobic exercise without signs/symptoms of physical distress. Continue with 45 min of aerobic exercise without signs/symptoms of physical distress.    Intensity THRR unchanged THRR unchanged THRR unchanged THRR unchanged      Progression   Progression Continue to progress workloads to  maintain intensity without signs/symptoms of physical distress. Continue to progress workloads to maintain intensity without signs/symptoms of physical distress. Continue to progress workloads to maintain intensity without signs/symptoms of physical distress. Continue to progress workloads to maintain intensity without signs/symptoms of physical distress.    Average METs  - 3.61 3.37 3.74      Resistance Training   Training Prescription Yes Yes Yes Yes    Weight '3 3 4 ' lbs 4 lbs    Reps 10-15 10-15 10-15 10-15      Interval Training   Interval Training  - No No No      Treadmill   MPH 2.8 2.8 2.8 2.8    Grade '1 1 1 1    ' Minutes '15 15 15 15    ' METs  - 3.53 3.53 3.53      Recumbant Bike   Level '3 3 3 4    ' RPM 80 60 65 67    Watts  - 30 23 37    Minutes '10 15 15 15    ' METs  - 3.53 3.21 3.57      Elliptical   Level '1 1 1 1    ' Speed  - 3.1 3.7  -    Minutes '5 15 15 15      ' Home Exercise Plan   Plans to continue exercise at  -  - [employer] (comment)  walking track at [place] (comment[place]    Frequency  -  - Add 2 additional days to program exercise sessions. Add 2 additional days to program exercise sessions.    Initial Home Exercises Provided  -  - 08/20/16 08/20/16       Exercise Comments:     Exercise Comments    Row Name 08/10/16 1526           Exercise Comments First full day of exercise!  Patient was oriented to gym and equipment including functions, settings, policies, and procedures.  Patient's individual exercise prescription and treatment plan were reviewed.  All starting workloads were established based on the results of the 6 minute walk test done at initial orientation visit.  The plan for exercise progression was also introduced and progression will be customized based on patient's performance and goals.          Exercise Goals and Review:     Exercise Goals    Row Name 08/03/16 1133              Exercise Goals   Increase Physical Activity Yes       Intervention Provide advice, education, support and counseling about physical activity/exercise needs.;Develop an individualized exercise prescription for aerobic and resistive training based on initial evaluation findings, risk stratification, comorbidities and participant's personal goals.       Expected Outcomes Achievement of increased cardiorespiratory fitness and enhanced flexibility, muscular endurance and strength shown through measurements of functional capacity and personal statement of participant.  Increase Strength and Stamina Yes       Intervention Provide advice, education, support and counseling about physical activity/exercise needs.;Develop an individualized exercise prescription for aerobic and resistive training based on initial evaluation findings, risk stratification, comorbidities and participant's personal goals.       Expected Outcomes Achievement of increased cardiorespiratory fitness and enhanced flexibility, muscular endurance and strength shown through measurements of functional capacity and personal statement of participant.          Exercise Goals Re-Evaluation :     Exercise Goals Re-Evaluation    Row Name 08/10/16 1525 08/24/16 1408 09/08/16 1442 09/22/16 1531 10/05/16 1615     Exercise Goal Re-Evaluation   Exercise Goals Review Increase Physical Activity;Increase Strenth and Stamina Increase Physical Activity;Increase Strenth and Stamina Increase Physical Activity;Increase Strenth and Stamina Increase Physical Activity;Increase Strenth and Stamina  -   Comments Oscar Sanchez has completed his first full day of exercise!  We will work with him on progression. Oscar Sanchez has been doing well in rehab.  He attends on Wednesdays and Fridays.  He is now up to 62 Watts on the recumbent bike.  We will continue to work on his progression. Oscar Sanchez continues to do well in rehab. He even came on Monday knowing he was going to miss a  day.  He is now up to 4 lbs weights.  We will continue to monitor his progression. Oscar Sanchez has been doing well in rehab.  He is nearing his mid program 6MWT.  He has continue to make improvements.  He is now up to level 4 on the recumbent bike.  We will continue to monitor his progress. Out since last review.   Expected Outcomes Short and Long: Come to class regularly to improve his strength and stamina. Short: Begin to increase workloads.  Long: Build more stamina and increase overall physical activity. Short: Move up workload on treadmill and bike.  Long: Make exercise part of routine. Short: Move up treadmill.  Long: Continue to work on improving strength and stamina.   -   Row Name 10/19/16 1548             Exercise Goal Re-Evaluation   Comments Out since last review.          Discharge Exercise Prescription (Final Exercise Prescription Changes):     Exercise Prescription Changes - 09/22/16 1500      Response to Exercise   Blood Pressure (Admit) 120/60   Blood Pressure (Exercise) 152/70   Blood Pressure (Exit) 118/60   Heart Rate (Admit) 91 bpm   Heart Rate (Exercise) 124 bpm   Heart Rate (Exit) 90 bpm   Oxygen Saturation (Admit) 92 %   Oxygen Saturation (Exercise) 89 %   Oxygen Saturation (Exit) 92 %   Rating of Perceived Exertion (Exercise) 13   Perceived Dyspnea (Exercise) 2   Symptoms none   Duration Continue with 45 min of aerobic exercise without signs/symptoms of physical distress.   Intensity THRR unchanged     Progression   Progression Continue to progress workloads to maintain intensity without signs/symptoms of physical distress.   Average METs 3.74     Resistance Training   Training Prescription Yes   Weight 4 lbs   Reps 10-15     Interval Training   Interval Training No     Treadmill   MPH 2.8   Grade 1   Minutes 15   METs 3.53     Recumbant Bike   Level 4   RPM  67   Watts 37   Minutes 15   METs 3.57     Elliptical   Level 1   Minutes 15      Home Exercise Plan   Plans to continue exercise at [employer] (comment)  [place]   Frequency Add 2 additional days to program exercise sessions.   Initial Home Exercises Provided 08/20/16      Nutrition:  Target Goals: Understanding of nutrition guidelines, daily intake of sodium <1523m, cholesterol <2011m calories 30% from fat and 7% or less from saturated fats, daily to have 5 or more servings of fruits and vegetables.  Biometrics:     Pre Biometrics - 08/03/16 1126      Pre Biometrics   Height '5\' 8"'  (1.727 m)   Weight 148 lb 1.6 oz (67.2 kg)   Waist Circumference 38 inches   Hip Circumference 40.75 inches   Waist to Hip Ratio 0.93 %   BMI (Calculated) 22.6       Nutrition Therapy Plan and Nutrition Goals:   Nutrition Discharge: Rate Your Plate Scores:   Nutrition Goals Re-Evaluation:   Nutrition Goals Discharge (Final Nutrition Goals Re-Evaluation):   Psychosocial: Target Goals: Acknowledge presence or absence of significant depression and/or stress, maximize coping skills, provide positive support system. Participant is able to verbalize types and ability to use techniques and skills needed for reducing stress and depression.   Initial Review & Psychosocial Screening:     Initial Psych Review & Screening - 08/03/16 [place]   Comments Mr WiOleskias good support from his church family. He is divorced and no children. 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 At age 89he found God and has been very blessed. 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 He is looking forward to [place] improve his shortness of breath.and increased stamina.     Barriers   Psychosocial barriers to participate in program The patient should benefit from training in stress management and relaxation.     Screening Interventions    Interventions Encouraged to exercise      Quality of Life Scores:     Quality of Life - 08/03/16 1142      Quality of Life Scores   Health/Function Pre 20.67 %   Socioeconomic Pre 20.71 %   Psych/Spiritual Pre 21 %   Family Pre 20.33 %   GLOBAL Pre 20.72 %      PHQ-9: Recent Review Flowsheet Data    [facility]   Decreased Interest 0   Down, Depressed, Hopeless 0   PHQ - 2 Score 0   Altered sleeping 3   Tired, decreased energy 1   Change in appetite 0   Feeling bad or failure about yourself  0   Trouble concentrating 0   Moving slowly or fidgety/restless 0   Suicidal thoughts 0   PHQ-9 Score 4   Difficult doing work/chores Not difficult at all     Interpretation of Total Score  Total Score Depression Severity:  1-4 = Minimal depression, 5-9 = Mild depression, 10-14 = Moderate depression, 15-19 = Moderately severe depression, 20-27 = Severe depression   Psychosocial Evaluation and Intervention:     Psychosocial Evaluation - 08/09/16 1242      Psychosocial Evaluation & Interventions   Interventions  Encouraged to exercise with the program and follow exercise prescription   Comments Counselor met with Oscar Sanchez Oscar Sanchez) today for initial psychosocial evaluation.  He is a 71 year old who has been diagnosed with COPD.  He lives alone but has several sisters who live locally and he is actively involved in his local church.  Oscar Sanchez reports he does not sleep well but he admits to "drinking a lot of coffee."  Counselor encouraged him to switch to decaf after noon and he agreed to try this.  He denies a history of depression or anxiety or current symptoms.  He states he is typically in a positive mood and he has minimal stress in his life - and relies on his faith for any stress that arises.  He has goals to increase his education about his disease and ways to improve his health.  He desires to increase his stamina and strength while in this program.  Staff  will be following with him throughout the course of this program.    Expected Outcomes Oscar Sanchez will exercise consistently to achieve his stated goals.  He will benefit from the educational components as well.     Continue Psychosocial Services  Follow up required by staff      Psychosocial Re-Evaluation:     Psychosocial Re-Evaluation    [place] Name 09/20/16 1431 09/20/16 1437           Psychosocial Re-Evaluation   Current issues with None Identified  -      Comments  - .Jewelz psychosocial assessment reveals no barriers at this time to participation in Pulmonary Rehab.  he  has good family and friend support that encourages Rajat to participate in [place] and progress with His goals.  Jesselee concerns are monitored, but he  has acknowledge that attending the program has helped to maintain quality life with improved mobility, self-care, and emotional and financial stability.  Rhoderick is commended for regular attendance and self-motivation to improve His pulmonary disease management.      Expected Outcomes  - Continue to exercise and maintain a stress free home environment.          Psychosocial Discharge (Final Psychosocial Re-Evaluation):     Psychosocial Re-Evaluation - 09/20/16 1437      Psychosocial Re-Evaluation   Comments .Calel psychosocial assessment reveals no barriers at this time to participation in Pulmonary Rehab.  he  has good family and friend support that encourages Kiano to participate in [place] and progress with His goals.  Damoni concerns are monitored, but he  has acknowledge that attending the program has helped to maintain quality life with improved mobility, self-care, and emotional and financial stability.  Gabino is commended for regular attendance and self-motivation to improve His pulmonary disease management.   Expected Outcomes Continue to exercise and maintain a stress free home environment.       Education: Education Goals: Education classes will be  provided on a weekly basis, covering required topics. Participant will state understanding/return demonstration of topics presented.  Learning Barriers/Preferences:     Learning Barriers/Preferences - 08/03/16 1131      Learning Barriers/Preferences   Learning Barriers None   Learning Preferences None      Education Topics: Initial Evaluation Education: - Verbal, written and demonstration of respiratory meds, RPE/PD scales, oximetry and breathing techniques. Instruction on use of nebulizers and MDIs: cleaning and proper use, rinsing mouth with steroid doses and importance of monitoring MDI activations.   Pulmonary Rehab from 09/15/2016 in [facility]  Cardiac and Pulmonary Rehab  Date  08/03/16  Educator  LB  Instruction Review Code  2- meets goals/outcomes      General Nutrition Guidelines/Fats and Fiber: -Group instruction provided by verbal, written material, models and posters to present the general guidelines for heart healthy nutrition. Gives an explanation and review of dietary fats and fiber.   Controlling Sodium/Reading Food Labels: -Group verbal and written material supporting the discussion of sodium use in heart healthy nutrition. Review and explanation with models, verbal and written materials for utilization of the food label.   Pulmonary Rehab from 09/15/2016 in [facility] Cardiac and Pulmonary Rehab  Date  08/09/16  Educator  CR  Instruction Review Code  2- meets goals/outcomes      Exercise Physiology & Risk Factors: - Group verbal and written instruction with models to review the exercise physiology of the cardiovascular system and associated critical values. Details cardiovascular disease risk factors and the goals associated with each risk factor.   Aerobic Exercise & Resistance Training: - Gives group verbal and written discussion on the health impact of inactivity. On the components of aerobic and resistive training programs and the benefits of this training and how to  safely progress through these programs.   Flexibility, Balance, General Exercise Guidelines: - Provides group verbal and written instruction on the benefits of flexibility and balance training programs. Provides general exercise guidelines with specific guidelines to those with heart or lung disease. Demonstration and skill practice provided.   Pulmonary Rehab from 09/15/2016 in [facility] Cardiac and Pulmonary Rehab  Date  09/03/16  Educator  AS  Instruction Review Code  2- meets goals/outcomes      Stress Management: - Provides group verbal and written instruction about the health risks of elevated stress, cause of high stress, and healthy ways to reduce stress.   Depression: - Provides group verbal and written instruction on the correlation between heart/lung disease and depressed mood, treatment options, and the stigmas associated with seeking treatment.   Pulmonary Rehab from 09/15/2016 in [facility] Cardiac and Pulmonary Rehab  Date  09/01/16  Educator  [facility]  Instruction Review Code  2- meets goals/outcomes      Exercise & Equipment Safety: - Individual verbal instruction and demonstration of equipment use and safety with use of the equipment.   Pulmonary Rehab from 09/15/2016 in [facility] Cardiac and Pulmonary Rehab  Date  08/09/16  Educator  LB  Instruction Review Code  2- meets goals/outcomes      Infection Prevention: - Provides verbal and written material to individual with discussion of infection control including proper hand washing and proper equipment cleaning during exercise session.   Pulmonary Rehab from 09/15/2016 in [facility] Cardiac and Pulmonary Rehab  Date  08/03/16  Educator  LB  Instruction Review Code  2- meets goals/outcomes      Falls Prevention: - Provides verbal and written material to individual with discussion of falls prevention and safety.   Pulmonary Rehab from 09/15/2016 in [facility] Cardiac and Pulmonary Rehab  Date  08/03/16  Educator  LB  Instruction Review Code   2- meets goals/outcomes      Diabetes: - Individual verbal and written instruction to review signs/symptoms of diabetes, desired ranges of glucose level fasting, after meals and with exercise. Advice that pre and post exercise glucose checks will be done for 3 sessions at entry of program.   Chronic Lung Diseases: - Group verbal and written instruction to review new updates, new respiratory medications, new advancements in procedures and  treatments. Provide informative websites and "800" numbers of self-education.   Lung Procedures: - Group verbal and written instruction to describe testing methods done to diagnose lung disease. Review the outcome of test results. Describe the treatment choices: Pulmonary Function Tests, ABGs and oximetry.   Energy Conservation: - Provide group verbal and written instruction for methods to conserve energy, plan and organize activities. Instruct on pacing techniques, use of adaptive equipment and posture/positioning to relieve shortness of breath.   Pulmonary Rehab from 09/15/2016 in [facility] Cardiac and Pulmonary Rehab  Date  09/15/16  Educator  [facility]  Instruction Review Code  2- meets goals/outcomes      Triggers: - Group verbal and written instruction to review types of environmental controls: home humidity, furnaces, filters, dust mite/pet prevention, HEPA vacuums. To discuss weather changes, air quality and the benefits of nasal washing.   Exacerbations: - Group verbal and written instruction to provide: warning signs, infection symptoms, calling MD promptly, preventive modes, and value of vaccinations. Review: effective airway clearance, coughing and/or vibration techniques. Create an Sports administrator.   Oxygen: - Individual and group verbal and written instruction on oxygen therapy. Includes supplement oxygen, available portable oxygen systems, continuous and intermittent flow rates, oxygen safety, concentrators, and Medicare reimbursement for  oxygen.   Respiratory Medications: - Group verbal and written instruction to review medications for lung disease. Drug class, frequency, complications, importance of spacers, rinsing mouth after steroid MDI's, and proper cleaning methods for nebulizers.   Pulmonary Rehab from 09/15/2016 in [facility] Cardiac and Pulmonary Rehab  Date  08/03/16  Educator  LB  Instruction Review Code  2- meets goals/outcomes      AED/CPR: - Group verbal and written instruction with the use of models to demonstrate the basic use of the AED with the basic ABC's of resuscitation.   Breathing Retraining: - Provides individuals verbal and written instruction on purpose, frequency, and proper technique of diaphragmatic breathing and pursed-lipped breathing. Applies individual practice skills.   Pulmonary Rehab from 09/15/2016 in [facility] Cardiac and Pulmonary Rehab  Date  08/03/16  Educator  LB  Instruction Review Code  2- meets goals/outcomes      Anatomy and Physiology of the Lungs: - Group verbal and written instruction with the use of models to provide basic lung anatomy and physiology related to function, structure and complications of lung disease.   Pulmonary Rehab from 09/15/2016 in [facility] Cardiac and Pulmonary Rehab  Date  09/08/16  Educator  LB  Instruction Review Code  2- meets goals/outcomes      Heart Failure: - Group verbal and written instruction on the basics of heart failure: signs/symptoms, treatments, explanation of ejection fraction, enlarged heart and cardiomyopathy.   Pulmonary Rehab from 09/15/2016 in [facility] Cardiac and Pulmonary Rehab  Date  08/20/16  Educator  CE  Instruction Review Code  2- meets goals/outcomes      Sleep Apnea: - Individual verbal and written instruction to review Obstructive Sleep Apnea. Review of risk factors, methods for diagnosing and types of masks and machines for OSA.   Anxiety: - Provides group, verbal and written instruction on the correlation between  heart/lung disease and anxiety, treatment options, and management of anxiety.   Relaxation: - Provides group, verbal and written instruction about the benefits of relaxation for patients with heart/lung disease. Also provides patients with examples of relaxation techniques.   Knowledge Questionnaire Score:     Knowledge Questionnaire Score - 08/03/16 1131      Knowledge Questionnaire Score   Pre  Score 6/10       Core Components/Risk Factors/Patient Goals at Admission:     Personal Goals and Risk Factors at Admission - 08/03/16 1135      Core Components/Risk Factors/Patient Goals on Admission   Improve shortness of breath with ADL's Yes   Intervention Provide education, individualized exercise plan and daily activity instruction to help decrease symptoms of SOB with activities of daily living.   Expected Outcomes Short Term: Achieves a reduction of symptoms when performing activities of daily living.   Develop more efficient breathing techniques such as purse lipped breathing and diaphragmatic breathing; and practicing self-pacing with activity Yes   Intervention Provide education, demonstration and support about specific breathing techniuqes utilized for more efficient breathing. Include techniques such as pursed lipped breathing, diaphragmatic breathing and self-pacing activity.   Expected Outcomes Short Term: Participant will be able to demonstrate and use breathing techniques as needed throughout daily activities.   Increase knowledge of respiratory medications and ability to use respiratory devices properly  Yes  Spiriva, rarely uses Proventil   Intervention Provide education and demonstration as needed of appropriate use of medications, inhalers, and oxygen therapy.   Expected Outcomes Short Term: Achieves understanding of medications use. Understands that oxygen is a medication prescribed by physician. Demonstrates appropriate use of inhaler and oxygen therapy.      Core  Components/Risk Factors/Patient Goals Review:      Goals and Risk Factor Review    Row Name 09/20/16 1419             Core Components/Risk Factors/Patient Goals Review   Personal Goals Review Improve shortness of breath with ADL's;Develop more efficient breathing techniques such as purse lipped breathing and diaphragmatic breathing and practicing self-pacing with activity.;Increase knowledge of respiratory medications and ability to use respiratory devices properly.       Review Patients states shortness of breath has improved slighty since his time in the program. Patient enjoys the program and if determind to get stonger and work on his exercises.  Oscar Sanchez likes doing yard work and wants to be able to manage church events without getting short of breath.       Expected Outcomes Continue exercising in [facility], Have patient use PLB with his activities at home and with his church to keep improving his self management of COPD.          Core Components/Risk Factors/Patient Goals at Discharge (Final Review):      Goals and Risk Factor Review - 09/20/16 1419      Core Components/Risk Factors/Patient Goals Review   Personal Goals Review Improve shortness of breath with ADL's;Develop more efficient breathing techniques such as purse lipped breathing and diaphragmatic breathing and practicing self-pacing with activity.;Increase knowledge of respiratory medications and ability to use respiratory devices properly.   Review Patients states shortness of breath has improved slighty since his time in the program. Patient enjoys the program and if determind to get stonger and work on his exercises.  Oscar Sanchez likes doing yard work and wants to be able to manage church events without getting short of breath.   Expected Outcomes Continue exercising in [facility], Have patient use PLB with his activities at home and with his church to keep improving his self management of COPD.      ITP Comments:      ITP Comments    Row Name 08/03/16 1135 08/27/16 1306 09/06/16 0850 10/04/16 0812 10/04/16 1321   ITP Comments Face to face meeting with Dr  Emily Filbert, [place] Director today. Took pt to visit medical director today for face to face meeting.  Dr. Caryl Comes signed and saw pt for Dr. Sabra Heck who was out. 30 day note review by Dr Emily Filbert, Medical Director of [place] 30 day note review with Dr Emily Filbert, Medical Director of [facility] Called Oscar Sanchez to see if he was doing ok and why he was not in class last week. His sister had lung surgery and has been with her since she had a brain aneurysm post surgery. He states when things calm down he will be back.    [place] Name 10/05/16 1424 10/05/16 1428 10/26/16 0906       ITP Comments Oscar Sanchez was not present the day  for his 30 day face to face review with the medical director on 6/26.  Will complete the face to face when he returns to  the program. Oscar Sanchez was not present the day  for his 30 day face to face review with the medical director on 6/26.  Will complete the face to face when he returns to  the program. Arley Phenix to check up on him this morning. His sister is doing much better now that she is out of the hospital. Pike said he would like to return in the next month or later when is sister is back in good health. Informed patient he will need a new referral from his physician the reenter the program. Jerrald confirmed and understands instructions.         Comments: discharge ITP

## 2016-10-26 NOTE — Telephone Encounter (Signed)
Called Oscar Sanchez to check up on him this morning. His sister is doing much better now that she is out of the hospital. Oscar Sanchez said he would like to return in the next month or later when is sister is back in good health. Informed patient he will need a new referral from his physician the reenter the program. [facility] confirmed and understands instructions.

## 2018-05-08 ENCOUNTER — Emergency Department
Admission: EM | Admit: 2018-05-08 | Discharge: 2018-05-08 | Disposition: A | Discharge status: Home / Self Care | Payer: Medicare PPO | Attending: Emergency Medicine | Admitting: Emergency Medicine

## 2018-05-08 ENCOUNTER — Other Ambulatory Visit: Payer: Self-pay

## 2018-05-08 ENCOUNTER — Emergency Department: Payer: Medicare PPO

## 2018-05-08 DIAGNOSIS — Z79899 Other long term (current) drug therapy: Secondary | ICD-10-CM | POA: Diagnosis not present

## 2018-05-08 DIAGNOSIS — J449 Chronic obstructive pulmonary disease, unspecified: Secondary | ICD-10-CM | POA: Diagnosis not present

## 2018-05-08 DIAGNOSIS — R0602 Shortness of breath: Secondary | ICD-10-CM | POA: Diagnosis not present

## 2018-05-08 DIAGNOSIS — J101 Influenza due to other identified influenza virus with other respiratory manifestations: Secondary | ICD-10-CM | POA: Diagnosis not present

## 2018-05-08 DIAGNOSIS — Z87891 Personal history of nicotine dependence: Secondary | ICD-10-CM | POA: Diagnosis not present

## 2018-05-08 DIAGNOSIS — R6883 Chills (without fever): Secondary | ICD-10-CM | POA: Diagnosis present

## 2018-05-08 LAB — COMPREHENSIVE METABOLIC PANEL
ALBUMIN: 3.9 g/dL (ref 3.5–5.0)
ALK PHOS: 42 U/L (ref 38–126)
ALT: 20 U/L (ref 0–44)
AST: 28 U/L (ref 15–41)
Anion gap: 9 (ref 5–15)
BUN: 21 mg/dL (ref 8–23)
CALCIUM: 9.2 mg/dL (ref 8.9–10.3)
CO2: 28 mmol/L (ref 22–32)
CREATININE: 1.29 mg/dL — AB (ref 0.61–1.24)
Chloride: 96 mmol/L — ABNORMAL LOW (ref 98–111)
GFR calc Af Amer: >60 mL/min (ref >60)
GFR calc non Af Amer: 55 mL/min — ABNORMAL LOW (ref >60)
GLUCOSE: 104 mg/dL — AB (ref 70–99)
Potassium: 3.6 mmol/L (ref 3.5–5.1)
SODIUM: 133 mmol/L — AB (ref 135–145)
Total Bilirubin: 0.9 mg/dL (ref 0.3–1.2)
Total Protein: 8.3 g/dL — ABNORMAL HIGH (ref 6.5–8.1)

## 2018-05-08 LAB — CBC
HCT: 47.3 % (ref 39.0–52.0)
HEMOGLOBIN: 16.8 g/dL (ref 13.0–17.0)
MCH: 29.8 pg (ref 26.0–34.0)
MCHC: 35.5 g/dL (ref 30.0–36.0)
MCV: 84 fL (ref 80.0–100.0)
Platelets: 167 10*3/uL (ref 150–400)
RBC: 5.63 MIL/uL (ref 4.22–5.81)
RDW: 12.3 % (ref 11.5–15.5)
WBC: 6.5 10*3/uL (ref 4.0–10.5)
nRBC: 0 % (ref 0.0–0.2)

## 2018-05-08 LAB — INFLUENZA PANEL BY PCR (TYPE A & B)
INFLBPCR: NEGATIVE
Influenza A By PCR: POSITIVE — AB

## 2018-05-08 MED ORDER — METHYLPREDNISOLONE SODIUM SUCC 125 MG IJ SOLR
125.0000 mg | Freq: Once | INTRAMUSCULAR | Status: DC
  Filled 2018-05-08: qty 2

## 2018-05-08 MED ORDER — CEFTRIAXONE SODIUM 1 G IJ SOLR: 1.0000 g | Freq: Once | INTRAMUSCULAR | Status: DC

## 2018-05-08 MED ORDER — SODIUM CHLORIDE 0.9 % IV BOLUS
500.0000 mL | Freq: Once | INTRAVENOUS | Status: AC
  Administered 2018-05-08: 500 mL via INTRAVENOUS

## 2018-05-08 MED ORDER — CEFTRIAXONE SODIUM 1 G IJ SOLR
1.0000 g | Freq: Once | INTRAMUSCULAR | Status: AC
  Administered 2018-05-08: 1 g via INTRAMUSCULAR
  Filled 2018-05-08: qty 10

## 2018-05-08 MED ORDER — OSELTAMIVIR PHOSPHATE 30 MG PO CAPS: 30.0000 mg | ORAL_CAPSULE | Freq: Two times a day (BID) | ORAL | 0 refills | Status: DC

## 2018-05-08 MED ORDER — PREDNISONE 10 MG PO TABS: ORAL_TABLET | ORAL | 0 refills | Status: DC

## 2018-05-08 MED ORDER — OSELTAMIVIR PHOSPHATE 30 MG PO CAPS
30.0000 mg | ORAL_CAPSULE | Freq: Once | ORAL | Status: AC
  Administered 2018-05-08: 30 mg via ORAL
  Filled 2018-05-08: qty 1

## 2018-05-08 MED ORDER — ALBUTEROL SULFATE HFA 108 (90 BASE) MCG/ACT IN AERS: 2.0000 | INHALATION_SPRAY | Freq: Four times a day (QID) | RESPIRATORY_TRACT | 0 refills | Status: AC | PRN

## 2018-05-08 MED ORDER — ACETAMINOPHEN 325 MG PO TABS
650.0000 mg | ORAL_TABLET | Freq: Once | ORAL | Status: AC
  Administered 2018-05-08: 650 mg via ORAL
  Filled 2018-05-08: qty 2

## 2018-05-08 MED ORDER — AZITHROMYCIN 250 MG PO TABS: ORAL_TABLET | ORAL | 0 refills | Status: DC

## 2018-05-08 MED ORDER — IPRATROPIUM-ALBUTEROL 0.5-2.5 (3) MG/3ML IN SOLN
3.0000 mL | Freq: Once | RESPIRATORY_TRACT | Status: AC
  Administered 2018-05-08: 3 mL via RESPIRATORY_TRACT
  Filled 2018-05-08: qty 3

## 2018-05-08 MED ORDER — METHYLPREDNISOLONE SODIUM SUCC 125 MG IJ SOLR
125.0000 mg | Freq: Once | INTRAMUSCULAR | Status: AC
  Administered 2018-05-08: 125 mg via INTRAVENOUS
  Filled 2018-05-08: qty 2

## 2018-05-08 NOTE — ED Provider Notes (Signed)
[facility] Emergency Department Provider Note  ____________________________________________  Time seen: Approximately 2:10 PM  I have reviewed the triage vital signs and the nursing notes.   HISTORY  Chief Complaint Cough and Nasal Congestion    HPI Oscar Sanchez is a 73 y.o. male that presents to the emergency department for evaluation of chills, body aches, occasionally productive cough since Wednesday. He went to the eye doctor on Wednesday and started feeling bad shortly after.  Patient states that he occasionally feels chills but does not think he has had a high fever.  Cough is occasionally productive with white and yellow. He has one episode of diarrhea. He hasn't been eating as much.  He is drinking plenty of milk.  He has COPD and has chronic shortness of breath, that has not changed.  He was given a prescription for an albuterol inhaler but does not think it helps.  No chest pain, vomiting, abdominal pain.      History reviewed. No pertinent past medical history.  There are no active problems to display for this patient.   History reviewed. No pertinent surgical history.  Prior to Admission medications   Medication Sig Start Date End Date Taking? Authorizing Provider  meloxicam (MOBIC) 15 MG tablet Take 15 mg by mouth daily.   Yes [provider]  albuterol (PROVENTIL HFA;VENTOLIN HFA) 108 (90 Base) MCG/ACT inhaler Inhale 2 puffs into the lungs every 6 (six) hours as needed for wheezing or shortness of breath. 05/08/18   Laban Emperor, PA-C  azithromycin (ZITHROMAX Z-PAK) 250 MG tablet Take 2 tablets (500 mg) on  Day 1,  followed by 1 tablet (250 mg) once daily on Days 2 through 5. 05/08/18   Laban Emperor, PA-C  Diphenhyd-Hydrocort-Nystatin (FIRST-DUKES MOUTHWASH) SUSP Use as directed 10 mLs in the mouth or throat 4 (four) times daily. Mixed with 5 mL of viscous lidocaine for swish and swallow 05/17/16   Sable Feil, PA-C  lidocaine  (XYLOCAINE) 2 % solution Use as directed 5 mLs in the mouth or throat every 6 (six) hours as needed for mouth pain. Mixed with [facility] mouthwash swish and swallow. 05/17/16   Sable Feil, PA-C  oseltamivir (TAMIFLU) 30 MG capsule Take 1 capsule (30 mg total) by mouth 2 (two) times daily. 05/08/18   Laban Emperor, PA-C  predniSONE (DELTASONE) 10 MG tablet Take 6 tablets on day 1, take 5 tablets on day 2, take 4 tablets on day 3, take 3 tablets on day 4, take 2 tablets on day 5, take 1 tablet on day 6 05/08/18   Laban Emperor, PA-C  tiotropium (SPIRIVA) 18 MCG inhalation capsule Place 18 mcg into inhaler and inhale daily.    [provider]    Allergies Patient has no known allergies.  History reviewed. No pertinent family history.  Social History Social History   Tobacco Use  . Smoking status: Former Research scientist (life sciences)  . Smokeless tobacco: Never Used  Substance Use Topics  . Alcohol use: No  . Drug use: No     Review of Systems  Constitutional: Positive for chills. Eyes: No visual changes. No discharge. ENT: Positive for congestion and rhinorrhea. Cardiovascular: No chest pain. Respiratory: Positive for cough. No SOB. Gastrointestinal: No abdominal pain.  No nausea, no vomiting.  Positive for diarrhea x1.  No constipation. Musculoskeletal: Positive for body aches.  Skin: Negative for rash, abrasions, lacerations, ecchymosis. Neurological: Negative for headaches.   ____________________________________________   PHYSICAL EXAM:  VITAL SIGNS: ED Triage  Vitals [05/08/18 1257]  Enc Vitals Group     BP 138/84     Pulse Rate (!) 107     Resp      Temp 99.7 F (37.6 C)     Temp Source Oral     SpO2 91 %     Weight 150 lb (68 kg)     Height 5\' 9"  (1.753 m)     Head Circumference      Peak Flow      Pain Score      Pain Loc      Pain Edu?      Excl. in [place]?      Constitutional: Alert and oriented. Well appearing and in no acute distress. Eyes: Conjunctivae are normal.  PERRL. EOMI. No discharge. Head: Atraumatic. ENT: No frontal and maxillary sinus tenderness.      Ears: Tympanic membranes pearly gray with good landmarks. No discharge.      Nose: Mild congestion/rhinnorhea.      Mouth/Throat: Mucous membranes are moist. Oropharynx non-erythematous. Tonsils not enlarged. No exudates. Uvula midline. Neck: No stridor.   Hematological/Lymphatic/Immunilogical: No cervical lymphadenopathy. Cardiovascular: Normal rate, regular rhythm.  Good peripheral circulation. Respiratory: Normal respiratory effort without tachypnea or retractions. Lungs CTAB. Good air entry to the bases with no decreased or absent breath sounds. Gastrointestinal: Bowel sounds 4 quadrants. Soft and nontender to palpation. No guarding or rigidity. No palpable masses. No distention. Musculoskeletal: Full range of motion to all extremities. No gross deformities appreciated. Neurologic:  Normal speech and language. No gross focal neurologic deficits are appreciated.  Skin:  Skin is warm, dry and intact. No rash noted. Psychiatric: Mood and affect are normal. Speech and behavior are normal. Patient exhibits appropriate insight and judgement.   ____________________________________________   LABS (all labs ordered are listed, but only abnormal results are displayed)  Labs Reviewed  INFLUENZA PANEL BY PCR (TYPE A & B) - Abnormal; Notable for the following components:      Result Value   Influenza A By PCR POSITIVE (*)    All other components within normal limits  COMPREHENSIVE METABOLIC PANEL - Abnormal; Notable for the following components:   Sodium 133 (*)    Chloride 96 (*)    Glucose, Bld 104 (*)    Creatinine, Ser 1.29 (*)    Total Protein 8.3 (*)    GFR calc non Af Amer 55 (*)    All other components within normal limits  CBC   ____________________________________________  EKG  NSR ____________________________________________  RADIOLOGY Robinette Haines, personally viewed  and evaluated these images (plain radiographs) as part of my medical decision making, as well as reviewing the written report by the radiologist.  Dg Chest 2 View  Result Date: 05/08/2018 CLINICAL DATA:  Productive cough and fever for several days. EXAM: CHEST - 2 VIEW COMPARISON:  None. FINDINGS: No pneumothorax. The heart, hila, and mediastinum are normal. No pulmonary nodules or masses. Elevated lung volumes identified. Increased interstitial markings centrally without overt edema. No other acute abnormalities. IMPRESSION: 1. Elevated lung volumes suggest COPD or emphysema. Recommend clinical correlation. 2. Mild increase lung markings centrally may represent bronchitic change or an atypical infection. Focal infiltrate not seen. Electronically Signed   By: Dorise Bullion III M.D   On: 05/08/2018 14:39    ____________________________________________    PROCEDURES  Procedure(s) performed:    Procedures    Medications  ipratropium-albuterol (DUONEB) 0.5-2.5 (3) MG/3ML nebulizer solution 3 mL (3 mLs Nebulization Given 05/08/18 1512)  acetaminophen (TYLENOL) tablet 650 mg (650 mg Oral Given 05/08/18 1511)  methylPREDNISolone sodium succinate (SOLU-MEDROL) 125 mg/2 mL injection 125 mg (125 mg Intravenous Given 05/08/18 1537)  sodium chloride 0.9 % bolus 500 mL (0 mLs Intravenous Stopped 05/08/18 1631)  ipratropium-albuterol (DUONEB) 0.5-2.5 (3) MG/3ML nebulizer solution 3 mL (3 mLs Nebulization Given 05/08/18 1631)  oseltamivir (TAMIFLU) capsule 30 mg (30 mg Oral Given 05/08/18 1633)  cefTRIAXone (ROCEPHIN) injection 1 g (1 g Intramuscular Given 05/08/18 1657)     ____________________________________________   INITIAL IMPRESSION / ASSESSMENT AND PLAN / ED COURSE  Pertinent labs & imaging results that were available during my care of the patient were reviewed by me and considered in my medical decision making (see chart for details).  Review of the [place] CSRS was performed in accordance of the [place]  prior to dispensing any controlled drugs.   Patient's diagnosis is consistent with influenza. Vital signs and exam are reassuring.  Influenza A test is positive. Chest x-ray consistent with COPD with mild bronchitic changes/atypical infection. Patient was given 2 DuoNeb treatments and IM Solu-Medrol.  Dr. Quentin Cornwall was consulted and recommends steroids, breathing treatments and reevaluation.   No increased white blood cell count on CBC.  CMP remarkable for sodium 133, chloride 96, glucose 104, creatinine 1.29, protein 8.3, GFR 55.  He was given half a liter of fluids.   He was given a dose of IM ceftriaxone in the emergency department.  Patient does not feel short of breath.  O2 did not drop below 94% with ambulation.  Dr. Corky Downs is agreeable with plan of discharge.  Patient feels comfortable going home and wants to go home.  Patient is drinking water in the room.  He is up walking around the emergency department and walked himself into the emergency room. He is talkative.  Patient will be discharged home with prescriptions for azithromycin, prednisone, tamiflu, albuterol. Patient is to follow up with PCP as needed or otherwise directed. Patient is given ED precautions to return to the ED for any worsening or new symptoms.     ____________________________________________  FINAL CLINICAL IMPRESSION(S) / ED DIAGNOSES  Final diagnoses:  Influenza A      NEW MEDICATIONS STARTED DURING THIS VISIT:  ED Discharge Orders         Ordered    azithromycin (ZITHROMAX Z-PAK) 250 MG tablet     05/08/18 1643    predniSONE (DELTASONE) 10 MG tablet     05/08/18 1643    oseltamivir (TAMIFLU) 30 MG capsule  2 times daily     05/08/18 1643    albuterol (PROVENTIL HFA;VENTOLIN HFA) 108 (90 Base) MCG/ACT inhaler  Every 6 hours PRN     05/08/18 1643              This chart was dictated using voice recognition software/Dragon. Despite best efforts to proofread, errors can occur which can change the  meaning. Any change was purely unintentional.    Laban Emperor, PA-C 05/09/18 1358    Lavonia Drafts, MD 05/12/18 [phone]

## 2018-05-08 NOTE — ED Notes (Signed)
See triage note  Presents with body aches and low grade fever  States he developed fever and body aches on weds  States he started feeling bad late weds.

## 2018-05-08 NOTE — ED Notes (Signed)
First RN: mask placed upon arrival.

## 2018-05-08 NOTE — ED Triage Notes (Signed)
Pt states symptoms since Wednesday. Cough and congestion.  Intermittent fever per pt.  A&O, ambulatory.

## 2018-05-08 NOTE — Discharge Instructions (Signed)
You tested positive for influenza A.  Please begin antibiotics, steroids, Tamiflu tomorrow morning.  Use inhaler as needed for shortness of breath.  Please follow-up with your primary care provider this week.  Return to the emergency department for any worsening symptoms.

## 2018-05-08 NOTE — ED Notes (Signed)
Ambulate in hall, gait steady. SAT 94 room air, HR 102.

## 2018-11-05 ENCOUNTER — Emergency Department
Admission: EM | Admit: 2018-11-05 | Discharge: 2018-11-05 | Disposition: A | Discharge status: Home / Self Care | Payer: Medicare PPO | Attending: Emergency Medicine | Admitting: Emergency Medicine

## 2018-11-05 ENCOUNTER — Encounter: Payer: Self-pay | Admitting: Emergency Medicine

## 2018-11-05 ENCOUNTER — Other Ambulatory Visit: Payer: Self-pay

## 2018-11-05 DIAGNOSIS — R1110 Vomiting, unspecified: Secondary | ICD-10-CM | POA: Diagnosis present

## 2018-11-05 DIAGNOSIS — Z79899 Other long term (current) drug therapy: Secondary | ICD-10-CM | POA: Insufficient documentation

## 2018-11-05 DIAGNOSIS — Z87891 Personal history of nicotine dependence: Secondary | ICD-10-CM | POA: Diagnosis not present

## 2018-11-05 HISTORY — DX: Congenital diverticulum of esophagus: Q39.6

## 2018-11-05 LAB — COMPREHENSIVE METABOLIC PANEL
ALT: 15 U/L (ref 0–44)
AST: 15 U/L (ref 15–41)
Albumin: 4.1 g/dL (ref 3.5–5.0)
Alkaline Phosphatase: 58 U/L (ref 38–126)
Anion gap: 11 (ref 5–15)
BUN: 14 mg/dL (ref 8–23)
CO2: 25 mmol/L (ref 22–32)
Calcium: 9.8 mg/dL (ref 8.9–10.3)
Chloride: 100 mmol/L (ref 98–111)
Creatinine, Ser: 0.97 mg/dL (ref 0.61–1.24)
GFR calc Af Amer: >60 mL/min (ref >60)
GFR calc non Af Amer: >60 mL/min (ref >60)
Glucose, Bld: 123 mg/dL — ABNORMAL HIGH (ref 70–99)
Potassium: 3.6 mmol/L (ref 3.5–5.1)
Sodium: 136 mmol/L (ref 135–145)
Total Bilirubin: 1.4 mg/dL — ABNORMAL HIGH (ref 0.3–1.2)
Total Protein: 8.5 g/dL — ABNORMAL HIGH (ref 6.5–8.1)

## 2018-11-05 LAB — CBC
HCT: 46.1 % (ref 39.0–52.0)
Hemoglobin: 16.1 g/dL (ref 13.0–17.0)
MCH: 29.8 pg (ref 26.0–34.0)
MCHC: 34.9 g/dL (ref 30.0–36.0)
MCV: 85.2 fL (ref 80.0–100.0)
Platelets: 230 10*3/uL (ref 150–400)
RBC: 5.41 MIL/uL (ref 4.22–5.81)
RDW: 12.4 % (ref 11.5–15.5)
WBC: 15 10*3/uL — ABNORMAL HIGH (ref 4.0–10.5)
nRBC: 0 % (ref 0.0–0.2)

## 2018-11-05 LAB — LIPASE, BLOOD: Lipase: 29 U/L (ref 11–51)

## 2018-11-05 NOTE — ED Triage Notes (Addendum)
Pt arrived via POV with reports of stomach problems. Pt states when he eats and drinks later on he feels the food comes up.  Pt states x 1.5 months he has had something that comes back up that he states looks like old chewing tobacco.  Pt denies any black or dark stools at this time.   Pt has of GI problems in the past and has had endoscopies in the past. Goes to [facility] for [facility].   Pt is on protonix but states he stopped taking it 2 weeks ago because "It doesn't do nothing"

## 2018-11-05 NOTE — ED Notes (Signed)
Pt presents to the ED with stomach problems. Denies any pain. Denies any problems having bowel movements. States he has been spitting up something that looks like "coffee grounds".

## 2018-11-05 NOTE — Discharge Instructions (Addendum)
Please follow up with primary care physician as scheduled in 4 days. Please seek medical attention for any high fevers, chest pain, shortness of breath, change in behavior, persistent vomiting, bloody stool or any other new or concerning symptoms.

## 2018-11-05 NOTE — ED Provider Notes (Signed)
[facility] Emergency Department Provider Note   ____________________________________________   I have reviewed the triage vital signs and the nursing notes.   HISTORY  Chief Complaint Emesis History limited by: Not Limited   HPI Oscar Sanchez is a 73 y.o. male who presents to the emergency department today with concerns for emesis.  Patient states that his symptoms have been going on for about 2 months.  He feels that when he eats food he will then sometimes vomit it back up.  He says that he came into the hospital today because at church people were telling him to be evaluated.  He denies any new symptoms today.  Denies any significant pain with this.  Says that occasionally he will also vomit up some material that appears to him to be fine ground tobacco like.  He denies that it is dark.  He denies any significant abdominal pain.  Did make an appointment with his primary care doctor 4 days from now to help arrange GI follow-up.  He has seen GI in the past for esophageal issues and has had esophageal dilations in the past.  He denies any fevers.   Records reviewed. Per medical record review patient has a history of esophageal problems.  Past Medical History:  Diagnosis Date  . Esophageal diverticulum     There are no active problems to display for this patient.   No past surgical history on file.  Prior to Admission medications   Medication Sig Start Date End Date Taking? Authorizing Provider  albuterol (PROVENTIL HFA;VENTOLIN HFA) 108 (90 Base) MCG/ACT inhaler Inhale 2 puffs into the lungs every 6 (six) hours as needed for wheezing or shortness of breath. 05/08/18   Laban Emperor, PA-C  azithromycin (ZITHROMAX Z-PAK) 250 MG tablet Take 2 tablets (500 mg) on  Day 1,  followed by 1 tablet (250 mg) once daily on Days 2 through 5. 05/08/18   Laban Emperor, PA-C  Diphenhyd-Hydrocort-Nystatin (FIRST-DUKES MOUTHWASH) SUSP Use as directed 10 mLs in the mouth or  throat 4 (four) times daily. Mixed with 5 mL of viscous lidocaine for swish and swallow 05/17/16   Sable Feil, PA-C  lidocaine (XYLOCAINE) 2 % solution Use as directed 5 mLs in the mouth or throat every 6 (six) hours as needed for mouth pain. Mixed with [facility] mouthwash swish and swallow. 05/17/16   Sable Feil, PA-C  meloxicam (MOBIC) 15 MG tablet Take 15 mg by mouth daily.    [provider]  oseltamivir (TAMIFLU) 30 MG capsule Take 1 capsule (30 mg total) by mouth 2 (two) times daily. 05/08/18   Laban Emperor, PA-C  predniSONE (DELTASONE) 10 MG tablet Take 6 tablets on day 1, take 5 tablets on day 2, take 4 tablets on day 3, take 3 tablets on day 4, take 2 tablets on day 5, take 1 tablet on day 6 05/08/18   Laban Emperor, PA-C  tiotropium (SPIRIVA) 18 MCG inhalation capsule Place 18 mcg into inhaler and inhale daily.    [provider]    Allergies Patient has no known allergies.  No family history on file.  Social History Social History   Tobacco Use  . Smoking status: Former Research scientist (life sciences)  . Smokeless tobacco: Never Used  Substance Use Topics  . Alcohol use: No  . Drug use: No    Review of Systems Constitutional: No fever/chills Eyes: No visual changes. ENT: No sore throat. Cardiovascular: Denies chest pain. Respiratory: Denies shortness of breath. Gastrointestinal: No  abdominal pain.  Positive for vomiting.  Genitourinary: Negative for dysuria. Musculoskeletal: Negative for back pain. Skin: Negative for rash. Neurological: Negative for headaches, focal weakness or numbness.  ____________________________________________   PHYSICAL EXAM:  VITAL SIGNS: ED Triage Vitals  Enc Vitals Group     BP 11/05/18 0941 135/80     Pulse Rate 11/05/18 0941 (!) 114     Resp 11/05/18 0941 18     Temp 11/05/18 0941 99.7 F (37.6 C)     Temp Source 11/05/18 0941 Oral     SpO2 11/05/18 0941 96 %     Weight 11/05/18 0942 150 lb (68 kg)     Height 11/05/18 0942 5\' 9"   (1.753 m)     Head Circumference --      Peak Flow --      Pain Score 11/05/18 0946 0   Constitutional: Alert and oriented.  Eyes: Conjunctivae are normal.  ENT      Head: Normocephalic and atraumatic.      Nose: No congestion/rhinnorhea.      Mouth/Throat: Mucous membranes are moist.      Neck: No stridor. Hematological/Lymphatic/Immunilogical: No cervical lymphadenopathy. Cardiovascular: Normal rate, regular rhythm.  No murmurs, rubs, or gallops.  Respiratory: Normal respiratory effort without tachypnea nor retractions. Breath sounds are clear and equal bilaterally. No wheezes/rales/rhonchi. Gastrointestinal: Soft and non tender. No rebound. No guarding.  Genitourinary: Deferred Musculoskeletal: Normal range of motion in all extremities. No lower extremity edema. Neurologic:  Normal speech and language. No gross focal neurologic deficits are appreciated.  Skin:  Skin is warm, dry and intact. No rash noted. Psychiatric: Mood and affect are normal. Speech and behavior are normal. Patient exhibits appropriate insight and judgment.  ____________________________________________    LABS (pertinent positives/negatives)  Lipase 29 CBC wbc 15.0, hgb 16.1, plt 230 CMP wnl except glu 123, t protein 8.5, t bili 1.4  ____________________________________________   EKG  None  ____________________________________________    RADIOLOGY  None  ____________________________________________   PROCEDURES  Procedures  ____________________________________________   INITIAL IMPRESSION / ASSESSMENT AND PLAN / ED COURSE  Pertinent labs & imaging results that were available during my care of the patient were reviewed by me and considered in my medical decision making (see chart for details).   Patient presented to the emergency department today with primary complaint of emesis.  He did come in today after being told to by members of his church although he states that the symptoms have  been constant for 2 months without any significant change today.  He in fact already had a primary care doctor's appointment scheduled for 4 days from now.  Patient had a mild leukocytosis on the blood work but no concerning abdominal tenderness.  This point given length of symptoms and lack of abdominal tenderness do not feel any emergent imaging is necessary.  Patient already has close follow-up as scheduled with primary care.  Think it would be reasonable for patient to continue work-up as outpatient.  ____________________________________________   FINAL CLINICAL IMPRESSION(S) / ED DIAGNOSES  Final diagnoses:  Complaint of vomiting     Note: This dictation was prepared with Dragon dictation. Any transcriptional errors that result from this process are unintentional     Nance Pear, MD 11/05/18 1325

## 2019-03-04 ENCOUNTER — Emergency Department: Payer: Medicare PPO

## 2019-03-04 ENCOUNTER — Encounter: Payer: Self-pay | Admitting: Intensive Care

## 2019-03-04 ENCOUNTER — Inpatient Hospital Stay
Admission: EM | Admit: 2019-03-04 | Discharge: 2019-03-13 | DRG: 682 | Disposition: A | Discharge status: Hospice | Payer: Medicare PPO | Attending: Internal Medicine | Admitting: Internal Medicine

## 2019-03-04 ENCOUNTER — Other Ambulatory Visit: Payer: Self-pay

## 2019-03-04 ENCOUNTER — Observation Stay: Payer: Medicare PPO

## 2019-03-04 DIAGNOSIS — E876 Hypokalemia: Secondary | ICD-10-CM | POA: Diagnosis present

## 2019-03-04 DIAGNOSIS — Z66 Do not resuscitate: Secondary | ICD-10-CM | POA: Diagnosis present

## 2019-03-04 DIAGNOSIS — E43 Unspecified severe protein-calorie malnutrition: Secondary | ICD-10-CM

## 2019-03-04 DIAGNOSIS — E860 Dehydration: Secondary | ICD-10-CM

## 2019-03-04 DIAGNOSIS — Q396 Congenital diverticulum of esophagus: Secondary | ICD-10-CM

## 2019-03-04 DIAGNOSIS — Z681 Body mass index (BMI) 19 or less, adult: Secondary | ICD-10-CM

## 2019-03-04 DIAGNOSIS — R627 Adult failure to thrive: Secondary | ICD-10-CM | POA: Diagnosis present

## 2019-03-04 DIAGNOSIS — M545 Low back pain, unspecified: Secondary | ICD-10-CM

## 2019-03-04 DIAGNOSIS — K5909 Other constipation: Secondary | ICD-10-CM | POA: Diagnosis present

## 2019-03-04 DIAGNOSIS — Z931 Gastrostomy status: Secondary | ICD-10-CM

## 2019-03-04 DIAGNOSIS — J449 Chronic obstructive pulmonary disease, unspecified: Secondary | ICD-10-CM | POA: Diagnosis present

## 2019-03-04 DIAGNOSIS — E870 Hyperosmolality and hypernatremia: Secondary | ICD-10-CM

## 2019-03-04 DIAGNOSIS — N179 Acute kidney failure, unspecified: Principal | ICD-10-CM | POA: Diagnosis present

## 2019-03-04 DIAGNOSIS — Z791 Long term (current) use of non-steroidal anti-inflammatories (NSAID): Secondary | ICD-10-CM

## 2019-03-04 DIAGNOSIS — Z87891 Personal history of nicotine dependence: Secondary | ICD-10-CM

## 2019-03-04 DIAGNOSIS — G8929 Other chronic pain: Secondary | ICD-10-CM

## 2019-03-04 DIAGNOSIS — D500 Iron deficiency anemia secondary to blood loss (chronic): Secondary | ICD-10-CM | POA: Diagnosis present

## 2019-03-04 DIAGNOSIS — F1011 Alcohol abuse, in remission: Secondary | ICD-10-CM | POA: Diagnosis present

## 2019-03-04 DIAGNOSIS — Z20828 Contact with and (suspected) exposure to other viral communicable diseases: Secondary | ICD-10-CM | POA: Diagnosis present

## 2019-03-04 DIAGNOSIS — Z7951 Long term (current) use of inhaled steroids: Secondary | ICD-10-CM

## 2019-03-04 DIAGNOSIS — Z79899 Other long term (current) drug therapy: Secondary | ICD-10-CM

## 2019-03-04 DIAGNOSIS — E872 Acidosis: Secondary | ICD-10-CM | POA: Diagnosis present

## 2019-03-04 DIAGNOSIS — K220 Achalasia of cardia: Secondary | ICD-10-CM | POA: Diagnosis present

## 2019-03-04 DIAGNOSIS — K769 Liver disease, unspecified: Secondary | ICD-10-CM

## 2019-03-04 DIAGNOSIS — Z809 Family history of malignant neoplasm, unspecified: Secondary | ICD-10-CM

## 2019-03-04 DIAGNOSIS — R1900 Intra-abdominal and pelvic swelling, mass and lump, unspecified site: Secondary | ICD-10-CM

## 2019-03-04 DIAGNOSIS — C787 Secondary malignant neoplasm of liver and intrahepatic bile duct: Secondary | ICD-10-CM

## 2019-03-04 DIAGNOSIS — C801 Malignant (primary) neoplasm, unspecified: Secondary | ICD-10-CM | POA: Diagnosis present

## 2019-03-04 LAB — BASIC METABOLIC PANEL
Anion gap: 23 — ABNORMAL HIGH (ref 5–15)
BUN: 56 mg/dL — ABNORMAL HIGH (ref 8–23)
CO2: 24 mmol/L (ref 22–32)
Calcium: 12.6 mg/dL — ABNORMAL HIGH (ref 8.9–10.3)
Chloride: 105 mmol/L (ref 98–111)
Creatinine, Ser: 1.54 mg/dL — ABNORMAL HIGH (ref 0.61–1.24)
GFR calc Af Amer: 51 mL/min — ABNORMAL LOW (ref >60)
GFR calc non Af Amer: 44 mL/min — ABNORMAL LOW (ref >60)
Glucose, Bld: 158 mg/dL — ABNORMAL HIGH (ref 70–99)
Potassium: 3.6 mmol/L (ref 3.5–5.1)
Sodium: 152 mmol/L — ABNORMAL HIGH (ref 135–145)

## 2019-03-04 LAB — LACTIC ACID, PLASMA
Lactic Acid, Venous: 2.6 mmol/L (ref 0.5–1.9)
Lactic Acid, Venous: 2.6 mmol/L (ref 0.5–1.9)

## 2019-03-04 LAB — CBC
HCT: 50.2 % (ref 39.0–52.0)
Hemoglobin: 16.4 g/dL (ref 13.0–17.0)
MCH: 27.8 pg (ref 26.0–34.0)
MCHC: 32.7 g/dL (ref 30.0–36.0)
MCV: 85.2 fL (ref 80.0–100.0)
Platelets: 276 10*3/uL (ref 150–400)
RBC: 5.89 MIL/uL — ABNORMAL HIGH (ref 4.22–5.81)
RDW: 13.8 % (ref 11.5–15.5)
WBC: 19.3 10*3/uL — ABNORMAL HIGH (ref 4.0–10.5)
nRBC: 0 % (ref 0.0–0.2)

## 2019-03-04 LAB — PROCALCITONIN: Procalcitonin: <0.1 ng/mL

## 2019-03-04 LAB — TSH: TSH: 0.717 u[IU]/mL (ref 0.350–4.500)

## 2019-03-04 MED ORDER — MORPHINE SULFATE (PF) 4 MG/ML IV SOLN
4.0000 mg | Freq: Once | INTRAVENOUS | Status: AC
  Administered 2019-03-04: 4 mg via INTRAVENOUS
  Filled 2019-03-04: qty 1

## 2019-03-04 MED ORDER — ACETAMINOPHEN 325 MG PO TABS
650.0000 mg | ORAL_TABLET | Freq: Four times a day (QID) | ORAL | Status: DC | PRN
  Administered 2019-03-05: 650 mg via ORAL
  Filled 2019-03-04: qty 2

## 2019-03-04 MED ORDER — HEPARIN SODIUM (PORCINE) 5000 UNIT/ML IJ SOLN
5000.0000 [IU] | Freq: Three times a day (TID) | INTRAMUSCULAR | Status: DC
  Administered 2019-03-04 – 2019-03-05 (×4): 5000 [IU] via SUBCUTANEOUS
  Filled 2019-03-04 (×4): qty 1

## 2019-03-04 MED ORDER — LACTATED RINGERS IV BOLUS
1000.0000 mL | Freq: Once | INTRAVENOUS | Status: AC
  Administered 2019-03-04: 16:00:00 1000 mL via INTRAVENOUS

## 2019-03-04 MED ORDER — TRAZODONE HCL 50 MG PO TABS: 25.0000 mg | ORAL_TABLET | Freq: Every evening | ORAL | Status: DC | PRN

## 2019-03-04 MED ORDER — HYDROCODONE-ACETAMINOPHEN 5-325 MG PO TABS: 1.0000 | ORAL_TABLET | ORAL | Status: DC | PRN

## 2019-03-04 MED ORDER — ONDANSETRON HCL 4 MG PO TABS: 4.0000 mg | ORAL_TABLET | Freq: Four times a day (QID) | ORAL | Status: DC | PRN

## 2019-03-04 MED ORDER — LACTATED RINGERS IV BOLUS
1000.0000 mL | Freq: Once | INTRAVENOUS | Status: AC
  Administered 2019-03-04: 1000 mL via INTRAVENOUS

## 2019-03-04 MED ORDER — DOCUSATE SODIUM 100 MG PO CAPS
100.0000 mg | ORAL_CAPSULE | Freq: Two times a day (BID) | ORAL | Status: DC
  Filled 2019-03-04 (×3): qty 1

## 2019-03-04 MED ORDER — ALBUTEROL SULFATE (2.5 MG/3ML) 0.083% IN NEBU: 2.5000 mg | INHALATION_SOLUTION | Freq: Four times a day (QID) | RESPIRATORY_TRACT | Status: DC | PRN

## 2019-03-04 MED ORDER — ONDANSETRON HCL 4 MG/2ML IJ SOLN
4.0000 mg | Freq: Once | INTRAMUSCULAR | Status: AC
  Administered 2019-03-04: 16:00:00 4 mg via INTRAVENOUS
  Filled 2019-03-04: qty 2

## 2019-03-04 MED ORDER — ONDANSETRON HCL 4 MG/2ML IJ SOLN: 4.0000 mg | Freq: Four times a day (QID) | INTRAMUSCULAR | Status: DC | PRN

## 2019-03-04 MED ORDER — BISACODYL 5 MG PO TBEC: 5.0000 mg | DELAYED_RELEASE_TABLET | Freq: Every day | ORAL | Status: DC | PRN

## 2019-03-04 MED ORDER — SODIUM CHLORIDE 0.9 % IV SOLN
INTRAVENOUS | Status: DC
  Administered 2019-03-04: 22:00:00 via INTRAVENOUS

## 2019-03-04 MED ORDER — TIOTROPIUM BROMIDE MONOHYDRATE 18 MCG IN CAPS
18.0000 ug | ORAL_CAPSULE | Freq: Every day | RESPIRATORY_TRACT | Status: DC
  Filled 2019-03-04 (×2): qty 5

## 2019-03-04 MED ORDER — ALBUTEROL SULFATE HFA 108 (90 BASE) MCG/ACT IN AERS: 2.0000 | INHALATION_SPRAY | Freq: Four times a day (QID) | RESPIRATORY_TRACT | Status: DC | PRN

## 2019-03-04 MED ORDER — MORPHINE SULFATE (PF) 2 MG/ML IV SOLN
1.0000 mg | INTRAVENOUS | Status: DC | PRN
  Administered 2019-03-04 – 2019-03-13 (×12): 1 mg via INTRAVENOUS
  Filled 2019-03-04 (×12): qty 1

## 2019-03-04 MED ORDER — ACETAMINOPHEN 650 MG RE SUPP: 650.0000 mg | Freq: Four times a day (QID) | RECTAL | Status: DC | PRN

## 2019-03-04 NOTE — H&P (Addendum)
[facility] [place] NAME: Oscar Sanchez    MR#:  [id number]  DATE OF BIRTH:  07/28/1945  DATE OF ADMISSION:  03/04/2019  PRIMARY CARE PHYSICIAN: Wardell Honour, MD   REQUESTING/REFERRING PHYSICIAN: Blake Divine, MD  CHIEF COMPLAINT:   Chief Complaint  Patient presents with  . Back Sanchez  . Failure To Thrive  . Weakness    HISTORY OF PRESENT ILLNESS:  Oscar Sanchez  is a 73 y.o. male with a known history of esophageal diverticulum and achalasia status post PEG tube is being admitted for acute on chronic back Sanchez, Oscar Sanchez. Care giver is at bedside providing majority of history as patient seems very frustrated with not able to eat and is a lot of Sanchez.  She states that he has had increased difficulty with his PEG tube recently.  It seems to flow fine, however whenever he receives feeds he feels very nauseous and cannot continue.  He has not been vomiting and denies any abdominal Sanchez or diarrhea.  He was able to tolerate 1 tube feed per day for 3 days, but for the past 3 days he has not had anything more than a water flush.  He would normally have 6 cans of Nutren/day but he has not been able to take even 1  for last 3 days. He is feeling much weaker than usual and family is concerned that he is very dehydrated. He denies any lower extremity numbness or weakness, has not had any urinary retention or incontinence.  Patient request keeping him comfortable and getting his Sanchez under control and is willing to discuss with palliative care about hospice if he qualifies.  He was hoping to get surgery to fix his esophagus but he realizes that may not be an option at this point.  Per reading his medical records they would want him to gain 30 pounds to consider surgery. PAST MEDICAL HISTORY:   Past Medical History:  Diagnosis Date  . Esophageal diverticulum   . Chronic neck Sanchez  . Radicular low back Sanchez  . Esophageal spasm  . Esophageal diverticulum   . Nondependent alcohol abuse, in remission  . Pulmonary emphysema (CMS-HCC)  . Dysphagia  . Refeeding syndrome  . Protein-calorie malnutrition, severe (CMS-HCC)  . Dehydration with hypernatremia  . Achalasia  COPD  PAST SURGICAL HISTORY:  History reviewed. No pertinent surgical history. . CERVICAL SPINE SURGERY  x3  . EGD N/A 12/17/2018  Procedure: Upper Endoscopy; Surgeon: Kathreen Cosier, MD; Location: [facility]; Service: Gastroenterology; Laterality: N/A;  . ESOPHAGEAL DILATION N/A 11/29/2018  Procedure: Placement of a balloon to measure esophageal distensibility; Surgeon: Newell Coral., MD; Location: [place]; Service: Gastroenterology; Laterality: N/A; This procedure is performed only by: Dr. Nancie Neas, Dr. Dorris Singh, and Dr. Ardelle Lesches  . ESOPHAGOGASTRODOUDENOSCOPY W/BIOPSY N/A 11/29/2018  Procedure: EGD with FLIP; Surgeon: Newell Coral., MD; Location: [place]; Service: Gastroenterology; Laterality: N/A; This procedure is performed only by: Dr. Nancie Neas, Dr. Dorris Singh, and Dr. Ardelle Lesches  . ESOPHAGOSCOPY Jan 2013  endoscopic repair of diverticulum by Dr. [facility]  . HEMORROIDECTOMY   SOCIAL HISTORY:   Social History   Tobacco Use  . Smoking status: Former Research scientist (life sciences)  . Smokeless tobacco: Never Used  Substance Use Topics  . Alcohol use: No    FAMILY HISTORY:  History reviewed. No pertinent family history. Cancer in brother DRUG ALLERGIES:  No Known Allergies  REVIEW OF SYSTEMS:   Review of Systems  Constitutional: Positive for malaise/fatigue. Negative for diaphoresis, fever and weight loss.  HENT: Negative for ear discharge, ear Sanchez, hearing loss, nosebleeds, sore throat and tinnitus.   Eyes: Negative for blurred vision and Sanchez.  Respiratory: Negative for cough, hemoptysis, shortness of breath and wheezing.   Cardiovascular: Negative for chest Sanchez, palpitations,  orthopnea and leg swelling.  Gastrointestinal: Negative for abdominal Sanchez, blood in stool, constipation, diarrhea, heartburn, Sanchez and vomiting.  Genitourinary: Negative for dysuria, frequency and urgency.  Musculoskeletal: Positive for back Sanchez. Negative for myalgias.  Skin: Negative for itching and rash.  Neurological: Negative for dizziness, tingling, tremors, focal weakness, seizures, weakness and headaches.  Psychiatric/Behavioral: Negative for depression. The patient is not nervous/anxious.    MEDICATIONS AT HOME:   Prior to Admission medications   Medication Sig Start Date End Date Taking? Authorizing Provider  albuterol (PROVENTIL HFA;VENTOLIN HFA) 108 (90 Base) MCG/ACT inhaler Inhale 2 puffs into the lungs every 6 (six) hours as needed for wheezing or shortness of breath. 05/08/18   Laban Emperor, PA-C  azithromycin (ZITHROMAX Z-PAK) 250 MG tablet Take 2 tablets (500 mg) on  Day 1,  followed by 1 tablet (250 mg) once daily on Days 2 through 5. 05/08/18   Laban Emperor, PA-C  Diphenhyd-Hydrocort-Nystatin (FIRST-DUKES MOUTHWASH) SUSP Use as directed 10 mLs in the mouth or throat 4 (four) times daily. Mixed with 5 mL of viscous lidocaine for swish and swallow 05/17/16   Sable Feil, PA-C  lidocaine (XYLOCAINE) 2 % solution Use as directed 5 mLs in the mouth or throat every 6 (six) hours as needed for mouth Sanchez. Mixed with [facility] mouthwash swish and swallow. 05/17/16   Sable Feil, PA-C  meloxicam (MOBIC) 15 MG tablet Take 15 mg by mouth daily.    [provider]  oseltamivir (TAMIFLU) 30 MG capsule Take 1 capsule (30 mg total) by mouth 2 (two) times daily. 05/08/18   Laban Emperor, PA-C  predniSONE (DELTASONE) 10 MG tablet Take 6 tablets on day 1, take 5 tablets on day 2, take 4 tablets on day 3, take 3 tablets on day 4, take 2 tablets on day 5, take 1 tablet on day 6 05/08/18   Laban Emperor, PA-C  tiotropium (SPIRIVA) 18 MCG inhalation capsule Place 18 mcg into inhaler and  inhale daily.    [provider]      VITAL SIGNS:  Blood pressure 91/67, pulse (!) 135, temperature (!) 97.5 F (36.4 C), temperature source Oral, resp. rate (!) 24, height 5\' 9"  (1.753 m), weight 48.5 kg, SpO2 97 %.  PHYSICAL EXAMINATION:  Physical Exam  GENERAL:  73 y.o.-year-old patient lying in the bed with no acute distress.  He looks very cachectic. EYES: Pupils equal, round, reactive to light and accommodation. No scleral icterus. Extraocular muscles intact.  HEENT: Head atraumatic, normocephalic. Oropharynx and nasopharynx clear.  NECK:  Supple, no jugular venous distention. No thyroid enlargement, no tenderness.  LUNGS: Normal breath sounds bilaterally, no wheezing, rales,rhonchi or crepitation. No use of accessory muscles of respiration.  CARDIOVASCULAR: S1, S2 normal. No murmurs, rubs, or gallops.  ABDOMEN: Soft, nontender, nondistended. Bowel sounds present. No organomegaly or mass.  PEG tube in place without erythema, warmth, or tenderness. EXTREMITIES: No pedal edema, cyanosis, or clubbing.  NEUROLOGIC: Cranial nerves II through XII are intact. Muscle strength 5/5 in all extremities. Sensation intact. Gait not checked.  PSYCHIATRIC: The patient is alert and oriented  x 3.  SKIN: No obvious rash, lesion, or ulcer.  LABORATORY PANEL:   CBC Recent Labs  Lab 03/04/19 1412  WBC 19.3*  HGB 16.4  HCT 50.2  PLT 276   ------------------------------------------------------------------------------------------------------------------  Chemistries  Recent Labs  Lab 03/04/19 1412  NA 152*  K 3.6  CL 105  CO2 24  GLUCOSE 158*  BUN 56*  CREATININE 1.54*  CALCIUM 12.6*   ------------------------------------------------------------------------------------------------------------------  Cardiac Enzymes No results for input(s): TROPONINI in the last 168 hours.  ------------------------------------------------------------------------------------------------------------------  RADIOLOGY:  Dg Chest Portable 1 View  Result Date: 03/04/2019 CLINICAL DATA:  Weakness EXAM: PORTABLE CHEST 1 VIEW COMPARISON:  May 08, 2018 FINDINGS: Lungs are hyperexpanded. No edema or consolidation. Heart size and pulmonary vascularity are normal. No adenopathy. Gastrostomy catheter positioned in left upper quadrant. IMPRESSION: Lungs hyperexpanded. No edema or consolidation. Cardiac silhouette within normal limits. No adenopathy evident. Electronically Signed   By: Lowella Grip III M.D.   On: 03/04/2019 15:34   IMPRESSION AND PLAN:  Kayode Kornfeld is a 73 y.o. male with a history of achalasia, esophageal diverticulum, and malnutrition.   Acute kidney injury Likely from poor p.o. intake We will start him on IV hydration and monitor kidney function  Hypernatremia Likely due to dehydration from poor p.o. intake -Sodium is at 152, will start gentle hydration and monitor his sodium  Sinus tachycardia -Likely from severe back Sanchez -We will order Sanchez medicine and get x-ray of his back, monitor his heart rate  Acute on chronic back Sanchez  Get thoracolumbar x-rays, consider MRI if Sanchez continues or clinical worsening  Protein-calorie malnutrition, severe  -we will consult nutritionist for tube feeds and nutrition input,  - consider gastroenterology consultation if G tube does not work -at home he was scheduled to take Nutren 1.5 to six cans per day    Esophageal diverticulum/achalasia -followed at [facility] - goal of 30 pound weight gain to be a surgical candidate.  Iron deficiency anemia due to chronic blood loss -monitor blood counts   Patient is agreeable for palliative care consultation he also agreed for DNR   All the records are reviewed and case discussed with ED provider. Management plans discussed with the patient, family (caregiver at bedside) and  they are in agreement.  CODE STATUS: DNR  TOTAL TIME TAKING CARE OF THIS PATIENT: 45 minutes.    Max Sane M.D on 03/04/2019 at 5:05 PM  Between 7am to 6pm - Pager - [phone]  After 6pm go to [email or web address] - password [email or web address]  [facility]   CC: Primary care physician; Wardell Honour, MD   Note: This dictation was prepared with Dragon dictation along with smaller phrase technology. Any transcriptional errors that result from this process are unintentional.

## 2019-03-04 NOTE — ED Notes (Signed)
Date and time results received: 03/04/19 444 (use smartphrase ".now" to insert current time)  Test: Lactic Acid Critical Value: 2.6  Name of Provider Notified: Dr. Charna Archer  Orders Received? Or Actions Taken?: Actions Taken: N/A

## 2019-03-04 NOTE — ED Provider Notes (Signed)
[facility] Emergency Department Provider Note   ____________________________________________   First MD Initiated Contact with Patient 03/04/19 1512     (approximate)  I have reviewed the triage vital signs and the nursing notes.   HISTORY  Chief Complaint Back Pain, Failure To Thrive, and Weakness    HPI Oscar Sanchez is a 73 y.o. male with past medical history of esophageal diverticulum and achalasia status post PEG tube presents to the ED complaining of back pain and nausea.  Patient refers that his sister provides the majority of the history due to his discomfort and nausea.  She states that he has had increased difficulty with his PEG tube recently.  It seems to flow fine, however whenever he receives feeds he feels very nauseous and cannot continue.  He has not been vomiting and denies any abdominal pain or diarrhea.  He was able to tolerate 1 tube feed per day for 3 days, but for the past 3 days he has not had anything more than a water flush.  He denies any fevers, chills, cough, chest pain, or shortness of breath.  He has not had any dysuria or hematuria.  He is feeling much weaker than usual and family is concerned that he is very dehydrated.  He does complain of severe acute on chronic mid and lower back pain, which his sister states has been going on for multiple months.  He denies any lower extremity numbness or weakness, has not had any urinary retention or incontinence.        Past Medical History:  Diagnosis Date  . Esophageal diverticulum     Patient Active Problem List   Diagnosis Date Noted  . Acute kidney injury ([place]) 03/04/2019    History reviewed. No pertinent surgical history.  Prior to Admission medications   Medication Sig Start Date End Date Taking? Authorizing Provider  albuterol (PROVENTIL HFA;VENTOLIN HFA) 108 (90 Base) MCG/ACT inhaler Inhale 2 puffs into the lungs every 6 (six) hours as needed for wheezing or shortness  of breath. 05/08/18   Laban Emperor, PA-C  azithromycin (ZITHROMAX Z-PAK) 250 MG tablet Take 2 tablets (500 mg) on  Day 1,  followed by 1 tablet (250 mg) once daily on Days 2 through 5. 05/08/18   Laban Emperor, PA-C  Diphenhyd-Hydrocort-Nystatin (FIRST-DUKES MOUTHWASH) SUSP Use as directed 10 mLs in the mouth or throat 4 (four) times daily. Mixed with 5 mL of viscous lidocaine for swish and swallow 05/17/16   Sable Feil, PA-C  lidocaine (XYLOCAINE) 2 % solution Use as directed 5 mLs in the mouth or throat every 6 (six) hours as needed for mouth pain. Mixed with [facility] mouthwash swish and swallow. 05/17/16   Sable Feil, PA-C  meloxicam (MOBIC) 15 MG tablet Take 15 mg by mouth daily.    [provider]  oseltamivir (TAMIFLU) 30 MG capsule Take 1 capsule (30 mg total) by mouth 2 (two) times daily. 05/08/18   Laban Emperor, PA-C  predniSONE (DELTASONE) 10 MG tablet Take 6 tablets on day 1, take 5 tablets on day 2, take 4 tablets on day 3, take 3 tablets on day 4, take 2 tablets on day 5, take 1 tablet on day 6 05/08/18   Laban Emperor, PA-C  tiotropium (SPIRIVA) 18 MCG inhalation capsule Place 18 mcg into inhaler and inhale daily.    [provider]    Allergies Patient has no known allergies.  History reviewed. No pertinent family history.  Social History  Social History   Tobacco Use  . Smoking status: Former Research scientist (life sciences)  . Smokeless tobacco: Never Used  Substance Use Topics  . Alcohol use: No  . Drug use: No    Review of Systems  Constitutional: No fever/chills Eyes: No visual changes. ENT: No sore throat. Cardiovascular: Denies chest pain. Respiratory: Denies shortness of breath. Gastrointestinal: No abdominal pain.  Positive for nausea, no vomiting.  No diarrhea.  No constipation. Genitourinary: Negative for dysuria. Musculoskeletal: Negative for back pain. Skin: Negative for rash. Neurological: Negative for headaches, focal weakness or numbness.   ____________________________________________   PHYSICAL EXAM:  VITAL SIGNS: ED Triage Vitals  Enc Vitals Group     BP 03/04/19 1403 91/67     Pulse Rate 03/04/19 1403 (!) 135     Resp 03/04/19 1403 (!) 24     Temp 03/04/19 1403 (!) 97.5 F (36.4 C)     Temp Source 03/04/19 1403 Oral     SpO2 03/04/19 1402 97 %     Weight 03/04/19 1404 107 lb (48.5 kg)     Height 03/04/19 1404 5\' 9"  (1.753 m)     Head Circumference --      Peak Flow --      Pain Score 03/04/19 1403 10     Pain Loc --      Pain Edu? --      Excl. in [place]? --     Constitutional: Alert and oriented. Eyes: Conjunctivae are normal. Head: Atraumatic. Nose: No congestion/rhinnorhea. Mouth/Throat: Mucous membranes are very dry. Neck: Normal ROM Cardiovascular: Tachycardic, regular rhythm. Grossly normal heart sounds. Respiratory: Normal respiratory effort.  No retractions. Lungs CTAB. Gastrointestinal: Soft and nontender. No distention.  PEG tube in place without erythema, warmth, or tenderness. Genitourinary: deferred Musculoskeletal: No lower extremity tenderness nor edema. Neurologic:  Normal speech and language. No gross focal neurologic deficits are appreciated. Skin:  Skin is warm, dry and intact. No rash noted. Psychiatric: Mood and affect are normal. Speech and behavior are normal.  ____________________________________________   LABS (all labs ordered are listed, but only abnormal results are displayed)  Labs Reviewed  BASIC METABOLIC PANEL - Abnormal; Notable for the following components:      Result Value   Sodium 152 (*)    Glucose, Bld 158 (*)    BUN 56 (*)    Creatinine, Ser 1.54 (*)    Calcium 12.6 (*)    GFR calc non Af Amer 44 (*)    GFR calc Af Amer 51 (*)    Anion gap 23 (*)    All other components within normal limits  CBC - Abnormal; Notable for the following components:   WBC 19.3 (*)    RBC 5.89 (*)    All other components within normal limits  LACTIC ACID, PLASMA - Abnormal;  Notable for the following components:   Lactic Acid, Venous 2.6 (*)    All other components within normal limits  LACTIC ACID, PLASMA - Abnormal; Notable for the following components:   Lactic Acid, Venous 2.6 (*)    All other components within normal limits  CULTURE, BLOOD (ROUTINE X 2)  CULTURE, BLOOD (ROUTINE X 2)  SARS CORONAVIRUS 2 (TAT 6-24 HRS)  PROCALCITONIN  TSH  URINALYSIS, COMPLETE (UACMP) WITH MICROSCOPIC  PROCALCITONIN  CBC  HEMOGLOBIN A1C  COMPREHENSIVE METABOLIC PANEL   ____________________________________________  EKG  ED ECG REPORT I, Blake Divine, the attending physician, personally viewed and interpreted this ECG.   Date: 03/04/2019  EKG Time: 14:06  Rate: 132  Rhythm: sinus tachycardia  Axis: Normal  Intervals:none  ST&T Change: None   PROCEDURES  Procedure(s) performed (including Critical Care):  Procedures   ____________________________________________   INITIAL IMPRESSION / ASSESSMENT AND PLAN / ED COURSE       74 year old male with history of achalasia and esophageal diverticulum status post PEG tube placement presents to the ED for back pain and nausea.  Back pain is described as acute on chronic and he has no findings to suggest cauda equina.  No recent trauma to necessitate imaging, MRI not indicated at this time.  Patient does appear significantly dehydrated with dry mucous membranes and tachycardia, will hydrate with IV fluids.  His PEG tube seems to be functioning fine, however he has been having difficulty tolerating tube feeds.  Labs significant for leukocytosis, although I suspect this is related to hemoconcentration rather than acute infectious process.  Patient denies any infectious symptoms, will screen chest x-ray and UA, check procalcitonin, but hold off on antibiotics for now.  Labs are also significant for hypernatremia, likely secondary to dehydration.  Patient will require admission for further management of dehydration, will  discuss with hospitalist.      ____________________________________________   FINAL CLINICAL IMPRESSION(S) / ED DIAGNOSES  Final diagnoses:  Dehydration  AKI (acute kidney injury) ([place])  Hypernatremia  Achalasia     ED Discharge Orders    None       Note:  This document was prepared using Dragon voice recognition software and may include unintentional dictation errors.   Blake Divine, MD 03/05/19 0000

## 2019-03-04 NOTE — ED Notes (Signed)
Family member [phone] Felipa Emory states she is the patient's advocate appointed by the family.

## 2019-03-04 NOTE — ED Notes (Signed)
Pt provided with pillow

## 2019-03-04 NOTE — ED Notes (Signed)
One set of blood cultures sent to lab, this nurse unable to obtain 2nd set of blood culture. Mickel Baas, RN will try to obtain 2nd set

## 2019-03-04 NOTE — ED Triage Notes (Addendum)
Patient arrived by POV from home for weakness, unable to tube feed himself, and all over back pain from left shoulder down back. Patient has feeding tube present in abdomen to feed himself at home but reports he has been unable to for days.  A&O x4 in triage.

## 2019-03-05 DIAGNOSIS — Z66 Do not resuscitate: Secondary | ICD-10-CM | POA: Diagnosis present

## 2019-03-05 DIAGNOSIS — Z87891 Personal history of nicotine dependence: Secondary | ICD-10-CM | POA: Diagnosis not present

## 2019-03-05 DIAGNOSIS — E43 Unspecified severe protein-calorie malnutrition: Secondary | ICD-10-CM | POA: Diagnosis present

## 2019-03-05 DIAGNOSIS — K220 Achalasia of cardia: Secondary | ICD-10-CM

## 2019-03-05 DIAGNOSIS — Z809 Family history of malignant neoplasm, unspecified: Secondary | ICD-10-CM | POA: Diagnosis not present

## 2019-03-05 DIAGNOSIS — K769 Liver disease, unspecified: Secondary | ICD-10-CM | POA: Diagnosis not present

## 2019-03-05 DIAGNOSIS — R638 Other symptoms and signs concerning food and fluid intake: Secondary | ICD-10-CM | POA: Diagnosis not present

## 2019-03-05 DIAGNOSIS — G8929 Other chronic pain: Secondary | ICD-10-CM | POA: Diagnosis present

## 2019-03-05 DIAGNOSIS — E870 Hyperosmolality and hypernatremia: Secondary | ICD-10-CM

## 2019-03-05 DIAGNOSIS — E872 Acidosis: Secondary | ICD-10-CM | POA: Diagnosis present

## 2019-03-05 DIAGNOSIS — Z7951 Long term (current) use of inhaled steroids: Secondary | ICD-10-CM | POA: Diagnosis not present

## 2019-03-05 DIAGNOSIS — J449 Chronic obstructive pulmonary disease, unspecified: Secondary | ICD-10-CM | POA: Diagnosis present

## 2019-03-05 DIAGNOSIS — E876 Hypokalemia: Secondary | ICD-10-CM | POA: Diagnosis present

## 2019-03-05 DIAGNOSIS — Z681 Body mass index (BMI) 19 or less, adult: Secondary | ICD-10-CM | POA: Diagnosis not present

## 2019-03-05 DIAGNOSIS — R627 Adult failure to thrive: Secondary | ICD-10-CM | POA: Diagnosis present

## 2019-03-05 DIAGNOSIS — F1011 Alcohol abuse, in remission: Secondary | ICD-10-CM | POA: Diagnosis present

## 2019-03-05 DIAGNOSIS — Z791 Long term (current) use of non-steroidal anti-inflammatories (NSAID): Secondary | ICD-10-CM | POA: Diagnosis not present

## 2019-03-05 DIAGNOSIS — R1900 Intra-abdominal and pelvic swelling, mass and lump, unspecified site: Secondary | ICD-10-CM | POA: Diagnosis not present

## 2019-03-05 DIAGNOSIS — C787 Secondary malignant neoplasm of liver and intrahepatic bile duct: Secondary | ICD-10-CM | POA: Diagnosis present

## 2019-03-05 DIAGNOSIS — Z79899 Other long term (current) drug therapy: Secondary | ICD-10-CM | POA: Diagnosis not present

## 2019-03-05 DIAGNOSIS — Z20828 Contact with and (suspected) exposure to other viral communicable diseases: Secondary | ICD-10-CM | POA: Diagnosis present

## 2019-03-05 DIAGNOSIS — C801 Malignant (primary) neoplasm, unspecified: Secondary | ICD-10-CM | POA: Diagnosis not present

## 2019-03-05 DIAGNOSIS — R590 Localized enlarged lymph nodes: Secondary | ICD-10-CM | POA: Diagnosis not present

## 2019-03-05 DIAGNOSIS — E860 Dehydration: Secondary | ICD-10-CM | POA: Diagnosis present

## 2019-03-05 DIAGNOSIS — D500 Iron deficiency anemia secondary to blood loss (chronic): Secondary | ICD-10-CM | POA: Diagnosis present

## 2019-03-05 DIAGNOSIS — R634 Abnormal weight loss: Secondary | ICD-10-CM | POA: Diagnosis not present

## 2019-03-05 DIAGNOSIS — K7689 Other specified diseases of liver: Secondary | ICD-10-CM | POA: Diagnosis not present

## 2019-03-05 DIAGNOSIS — K5909 Other constipation: Secondary | ICD-10-CM | POA: Diagnosis present

## 2019-03-05 DIAGNOSIS — Z931 Gastrostomy status: Secondary | ICD-10-CM | POA: Diagnosis not present

## 2019-03-05 DIAGNOSIS — N179 Acute kidney failure, unspecified: Secondary | ICD-10-CM | POA: Diagnosis present

## 2019-03-05 DIAGNOSIS — M545 Low back pain: Secondary | ICD-10-CM | POA: Diagnosis not present

## 2019-03-05 DIAGNOSIS — Q396 Congenital diverticulum of esophagus: Secondary | ICD-10-CM | POA: Diagnosis not present

## 2019-03-05 DIAGNOSIS — Z594 Lack of adequate food and safe drinking water: Secondary | ICD-10-CM | POA: Diagnosis not present

## 2019-03-05 LAB — COMPREHENSIVE METABOLIC PANEL
ALT: 14 U/L (ref 0–44)
AST: 17 U/L (ref 15–41)
Albumin: 3.3 g/dL — ABNORMAL LOW (ref 3.5–5.0)
Alkaline Phosphatase: 68 U/L (ref 38–126)
Anion gap: 14 (ref 5–15)
BUN: 47 mg/dL — ABNORMAL HIGH (ref 8–23)
CO2: 26 mmol/L (ref 22–32)
Calcium: 11 mg/dL — ABNORMAL HIGH (ref 8.9–10.3)
Chloride: 114 mmol/L — ABNORMAL HIGH (ref 98–111)
Creatinine, Ser: 1.14 mg/dL (ref 0.61–1.24)
GFR calc Af Amer: >60 mL/min (ref >60)
GFR calc non Af Amer: >60 mL/min (ref >60)
Glucose, Bld: 123 mg/dL — ABNORMAL HIGH (ref 70–99)
Potassium: 3.4 mmol/L — ABNORMAL LOW (ref 3.5–5.1)
Sodium: 154 mmol/L — ABNORMAL HIGH (ref 135–145)
Total Bilirubin: 1 mg/dL (ref 0.3–1.2)
Total Protein: 7.7 g/dL (ref 6.5–8.1)

## 2019-03-05 LAB — PSA: Prostatic Specific Antigen: 1.73 ng/mL (ref 0.00–4.00)

## 2019-03-05 LAB — CBC
HCT: 40.5 % (ref 39.0–52.0)
Hemoglobin: 13 g/dL (ref 13.0–17.0)
MCH: 27.8 pg (ref 26.0–34.0)
MCHC: 32.1 g/dL (ref 30.0–36.0)
MCV: 86.5 fL (ref 80.0–100.0)
Platelets: 195 10*3/uL (ref 150–400)
RBC: 4.68 MIL/uL (ref 4.22–5.81)
RDW: 13.9 % (ref 11.5–15.5)
WBC: 13.7 10*3/uL — ABNORMAL HIGH (ref 4.0–10.5)
nRBC: 0 % (ref 0.0–0.2)

## 2019-03-05 LAB — HEMOGLOBIN A1C
Hgb A1c MFr Bld: 6.3 % — ABNORMAL HIGH (ref 4.8–5.6)
Mean Plasma Glucose: 134.11 mg/dL

## 2019-03-05 LAB — GLUCOSE, CAPILLARY
Glucose-Capillary: 104 mg/dL — ABNORMAL HIGH (ref 70–99)
Glucose-Capillary: 127 mg/dL — ABNORMAL HIGH (ref 70–99)

## 2019-03-05 LAB — PROCALCITONIN: Procalcitonin: <0.1 ng/mL

## 2019-03-05 LAB — SARS CORONAVIRUS 2 (TAT 6-24 HRS): SARS Coronavirus 2: NEGATIVE

## 2019-03-05 MED ORDER — JEVITY 1.2 CAL PO LIQD: 1000.0000 mL | ORAL | Status: DC

## 2019-03-05 MED ORDER — FREE WATER
120.0000 mL | Status: DC
  Administered 2019-03-05 – 2019-03-13 (×47): 120 mL

## 2019-03-05 MED ORDER — HYDROCODONE-ACETAMINOPHEN 7.5-325 MG/15ML PO SOLN
10.0000 mL | Freq: Four times a day (QID) | ORAL | Status: DC | PRN
  Administered 2019-03-07 – 2019-03-13 (×14): 10 mL
  Filled 2019-03-05 (×17): qty 15

## 2019-03-05 MED ORDER — OSMOLITE 1.5 CAL PO LIQD
1000.0000 mL | ORAL | Status: DC
  Administered 2019-03-05 – 2019-03-13 (×11): 1000 mL

## 2019-03-05 MED ORDER — POTASSIUM CL IN DEXTROSE 5% 20 MEQ/L IV SOLN
20.0000 meq | INTRAVENOUS | Status: DC
  Administered 2019-03-05 – 2019-03-08 (×5): 20 meq via INTRAVENOUS
  Filled 2019-03-05 (×10): qty 1000

## 2019-03-05 MED ORDER — LACTULOSE 10 GM/15ML PO SOLN
10.0000 g | Freq: Every day | ORAL | Status: DC
  Administered 2019-03-05 – 2019-03-06 (×2): 10 g
  Filled 2019-03-05 (×2): qty 30

## 2019-03-05 MED ORDER — DEXTROSE 5 % IV SOLN
INTRAVENOUS | Status: DC
  Administered 2019-03-05: 75 mL via INTRAVENOUS

## 2019-03-05 NOTE — Evaluation (Signed)
Physical Therapy Evaluation Patient Details Name: Oscar Sanchez MRN: [id number] DOB: 06/29/1945 Today's Date: 03/05/2019   History of Present Illness  Pt is a 73 y.o. male presenting to hospital 03/04/19 with back pain, nausea, weakness, and difficulty with PEG tube feedings.  Pt admitted with acute kidney injury, hypernatremia, sinus tachycardia, and acute on chronic back pain.  PMH includes esophageal diverticulum and achalasia s/p PEG tube, pulmonary emphysema, dysphagia, COPD, c-spine surgery.  Clinical Impression  Prior to hospital admission, pt was independent and lives alone.  Currently pt is modified independent semi-supine to sitting edge of bed and 1 assist with transfers.  Pt appearing unsteady trying to take steps/walk from bed to chair requiring hand hold assist/minimal assist for balance.  Pt initially reporting he would be able to try to walk after 5-10 minutes of sitting rest break but after a few minutes pt reporting that he was not going to be able to walk today d/t fatigue and generalized weakness.  Pt reporting 7/10 pain L shoulder/L side of back/L side of L thigh beginning and end of session (pain did not appear to change with session's activities); pt reports these symptoms for the past 3 months.  Anticipate pt would benefit from RW to improve balance with transfers and ambulation (and d/t generalized weakness).  Pt would benefit from skilled PT to address noted impairments and functional limitations (see below for any additional details).  Upon hospital discharge, pt would benefit from HHPT and increased support services at home.     Follow Up Recommendations Home health PT(plus increased support services at home)    Equipment Recommendations  Rolling walker with 5" wheels;3in1 (PT)    Recommendations for Other Services OT consult     Precautions / Restrictions Precautions Precautions: Fall Precaution Comments: NPO; PEG tube Restrictions Weight Bearing Restrictions:  No      Mobility  Bed Mobility Overal bed mobility: Modified Independent             General bed mobility comments: Semi-supine to sit with mild increased effort  Transfers Overall transfer level: Needs assistance   Transfers: Sit to/from Stand Sit to Stand: Min guard         General transfer comment: fairly strong stand from bed but pt holding onto bedrail for support  Ambulation/Gait Ambulation/Gait assistance: Min assist Gait Distance (Feet): 3 Feet(bed to recliner) Assistive device: 1 person hand held assist   Gait velocity: decreased   General Gait Details: pt appearing unsteady and trying to grab for objects to hold onto so therapist provided hand hold assist to take steps bed to recliner  Stairs            Wheelchair Mobility    Modified Rankin (Stroke Patients Only)       Balance Overall balance assessment: Needs assistance Sitting-balance support: No upper extremity supported;Feet supported Sitting balance-Leahy Scale: Normal Sitting balance - Comments: steady sitting reaching outside BOS   Standing balance support: Single extremity supported Standing balance-Leahy Scale: Poor Standing balance comment: pt requiring at least single UE support for static standing balance                             Pertinent Vitals/Pain   Vitals (HR and O2 on room air) stable and WFL throughout treatment session.    Home Living Family/patient expects to be discharged to:: Private residence Living Arrangements: Alone   Type of Home: House Home Access: Stairs to enter  Entrance Stairs-Rails: Right;Left;Can reach both Entrance Stairs-Number of Steps: mostly uses back entrance (3 STE with B railing) but also uses front entrance (4 STE with B railing) Home Layout: One level Home Equipment: Walker - 2 wheels;Grab bars - tub/shower      Prior Function Level of Independence: Independent         Comments: Pt reports no falls in past 6 months.      Hand Dominance        Extremity/Trunk Assessment   Upper Extremity Assessment Upper Extremity Assessment: Generalized weakness    Lower Extremity Assessment Lower Extremity Assessment: Generalized weakness    Cervical / Trunk Assessment Cervical / Trunk Assessment: Normal  Communication   Communication: No difficulties  Cognition Arousal/Alertness: Awake/alert Behavior During Therapy: WFL for tasks assessed/performed Overall Cognitive Status: Within Functional Limits for tasks assessed                                        General Comments   Nursing cleared pt for participation in physical therapy.  Pt agreeable to PT session.    Exercises     Assessment/Plan    PT Assessment Patient needs continued PT services  PT Problem List Decreased strength;Decreased activity tolerance;Decreased balance;Decreased mobility;Decreased knowledge of use of DME;Pain       PT Treatment Interventions DME instruction;Gait training;Stair training;Functional mobility training;Therapeutic activities;Therapeutic exercise;Balance training;Patient/family education    PT Goals (Current goals can be found in the Care Plan section)  Acute Rehab PT Goals Patient Stated Goal: to improve pain and mobility PT Goal Formulation: With patient Time For Goal Achievement: 03/19/19 Potential to Achieve Goals: Fair    Frequency Min 2X/week   Barriers to discharge Decreased caregiver support      Co-evaluation               AM-PAC PT "6 Clicks" Mobility  Outcome Measure Help needed turning from your back to your side while in a flat bed without using bedrails?: None Help needed moving from lying on your back to sitting on the side of a flat bed without using bedrails?: None Help needed moving to and from a bed to a chair (including a wheelchair)?: A Little Help needed standing up from a chair using your arms (e.g., wheelchair or bedside chair)?: A Little Help needed to  walk in hospital room?: A Little Help needed climbing 3-5 steps with a railing? : A Little 6 Click Score: 20    End of Session Equipment Utilized During Treatment: Gait belt(belt up high away from PEG tube) Activity Tolerance: Patient limited by fatigue Patient left: in chair;with call bell/phone within reach;with chair alarm set Nurse Communication: Mobility status;Precautions;Other (comment)(Pt's pain status) PT Visit Diagnosis: Other abnormalities of gait and mobility (R26.89);Muscle weakness (generalized) (M62.81);Unsteadiness on feet (R26.81);Difficulty in walking, not elsewhere classified (R26.2);Pain Pain - Right/Left: Left Pain - part of body: Shoulder    Time: [id number] PT Time Calculation (min) (ACUTE ONLY): 17 min   Charges:   PT Evaluation $PT Eval Low Complexity: 1 Low          Tailyn Hantz, PT 03/05/19, 1:23 PM

## 2019-03-05 NOTE — Plan of Care (Signed)

## 2019-03-05 NOTE — Progress Notes (Signed)
Patient ID: Oscar Sanchez, male   DOB: [birth year], 73 y.o.   MRN: [id number] [facility] PROGRESS NOTE  Oscar Sanchez [id number] DOB: Nov 30, 1945 DOA: 03/04/2019 PCP: Wardell Honour, MD  HPI/Subjective: Patient complains of back and neck pain.  He states he has pain in his right ribs going down both of his legs.  Has pain up in his left shoulder going down his back and down his left leg.  He has not been able to tolerate his feeding at home.  He came in dehydrated with electrolyte abnormalities.  At times he goes on long time without a bowel movement.  Objective: Vitals:   03/05/19 0700 03/05/19 0744  BP: 102/68 104/64  Pulse: 96 95  Resp: 16 17  Temp: 98.1 F (36.7 C) 98.2 F (36.8 C)  SpO2: 98% 97%    Intake/Output Summary (Last 24 hours) at 03/05/2019 1325 Last data filed at 03/05/2019 0511 Gross per 24 hour  Intake 681.5 ml  Output 575 ml  Net 106.5 ml   Filed Weights   03/04/19 1404 03/04/19 2114 03/05/19 0500  Weight: 48.5 kg 42.1 kg 43.7 kg    ROS: Review of Systems  Constitutional: Negative for chills and fever.  Eyes: Negative for blurred vision.  Respiratory: Negative for cough and shortness of breath.   Cardiovascular: Negative for chest pain.  Gastrointestinal: Positive for constipation. Negative for abdominal pain, diarrhea, nausea and vomiting.  Genitourinary: Negative for dysuria.  Musculoskeletal: Positive for back pain, joint pain and neck pain.  Neurological: Negative for dizziness and headaches.   Exam: Physical Exam  Constitutional: He is oriented to person, place, and time.  HENT:  Nose: No mucosal edema.  Mouth/Throat: No oropharyngeal exudate or posterior oropharyngeal edema.  Eyes: Pupils are equal, round, and reactive to light. Conjunctivae, EOM and lids are normal.  Neck: No JVD present. Carotid bruit is not present. No edema present. No thyroid mass and no thyromegaly present.  Cardiovascular: S1 normal and S2 normal. Exam  reveals no gallop.  No murmur heard. Pulses:      Dorsalis pedis pulses are 2+ on the right side and 2+ on the left side.  Respiratory: No respiratory distress. He has no wheezes. He has no rhonchi. He has no rales.  GI: Soft. Bowel sounds are normal. There is no abdominal tenderness.  Musculoskeletal:     Right ankle: He exhibits no swelling.     Left ankle: He exhibits no swelling.  Lymphadenopathy:    He has no cervical adenopathy.  Neurological: He is alert and oriented to person, place, and time. No cranial nerve deficit.  Able to straight leg raise  Skin: Skin is warm. No rash noted. Nails show no clubbing.  Psychiatric: He has a normal mood and affect.      Data Reviewed: Basic Metabolic Panel: Recent Labs  Lab 03/04/19 1412 03/05/19 0525  NA 152* 154*  K 3.6 3.4*  CL 105 114*  CO2 24 26  GLUCOSE 158* 123*  BUN 56* 47*  CREATININE 1.54* 1.14  CALCIUM 12.6* 11.0*   Liver Function Tests: Recent Labs  Lab 03/05/19 0525  AST 17  ALT 14  ALKPHOS 68  BILITOT 1.0  PROT 7.7  ALBUMIN 3.3*   CBC: Recent Labs  Lab 03/04/19 1412 03/05/19 0525  WBC 19.3* 13.7*  HGB 16.4 13.0  HCT 50.2 40.5  MCV 85.2 86.5  PLT 276 195    CBG: Recent Labs  Lab 03/05/19 0743  GLUCAP 104*  Recent Results (from the past 240 hour(s))  Culture, blood (routine x 2)     Status: None (Preliminary result)   Collection Time: 03/04/19  3:15 PM   Specimen: BLOOD  Result Value Ref Range Status   Specimen Description BLOOD RIGHT ANTECUBITAL  Final   Special Requests   Final    BOTTLES DRAWN AEROBIC AND ANAEROBIC Blood Culture results may not be optimal due to an inadequate volume of blood received in culture bottles   Culture   Final    NO GROWTH < 24 HOURS Performed at [facility], [address], [place], [place] [address]    Report Status PENDING  Incomplete  Culture, blood (routine x 2)     Status: None (Preliminary result)   Collection Time: 03/04/19  5:42 PM    Specimen: BLOOD  Result Value Ref Range Status   Specimen Description BLOOD FOREARM  Final   Special Requests   Final    BOTTLES DRAWN AEROBIC AND ANAEROBIC Blood Culture adequate volume   Culture   Final    NO GROWTH < 12 HOURS Performed at [facility], [address] [place], [place] [address]    Report Status PENDING  Incomplete  SARS CORONAVIRUS 2 (TAT 6-24 HRS) Nasopharyngeal Nasopharyngeal Swab     Status: None   Collection Time: 03/04/19  6:25 PM   Specimen: Nasopharyngeal Swab  Result Value Ref Range Status   SARS Coronavirus 2 NEGATIVE NEGATIVE Final    Comment: (NOTE) SARS-CoV-2 target nucleic acids are NOT DETECTED. The SARS-CoV-2 RNA is generally detectable in upper and lower respiratory specimens during the acute phase of infection. Negative results do not preclude SARS-CoV-2 infection, do not rule out co-infections with other pathogens, and should not be used as the sole basis for treatment or other patient management decisions. Negative results must be combined with clinical observations, patient history, and epidemiological information. The expected result is Negative. Fact Sheet for Patients: SugarRoll.be Fact Sheet for Healthcare Providers: https://www.woods-mathews.com/ This test is not yet approved or cleared by the [place] FDA and  has been authorized for detection and/or diagnosis of SARS-CoV-2 by FDA under an Emergency Use Authorization (EUA). This EUA will remain  in effect (meaning this test can be used) for the duration of the COVID-19 declaration under Section 56 4(b)(1) of the Act, 21 U.S.C. section 360bbb-3(b)(1), unless the authorization is terminated or revoked sooner. Performed at [facility], [place] [address] [place], [place] [address]      Studies: Dg Thoracic Spine 2 View  Result Date: 03/04/2019 CLINICAL DATA:  Diffuse back pain.  Generalized weakness EXAM: THORACIC  SPINE 2 VIEWS COMPARISON:  None. FINDINGS: Negative for fracture or listhesis. Mild lower thoracic and midthoracic endplate spurring. No evidence of erosion or bone lesion. C5-6 and C6-7 interbody arthrodesis with multilevel laminectomy involving lower cervical and upper lumbar spine. There is exaggerated kyphosis at the cervicothoracic junction. Maintained posterior mediastinal fat planes. IMPRESSION: 1. No acute or focal finding. 2. Lower cervical ACDF and laminectomy with kyphosis at the cervicothoracic junction. Electronically Signed   By: Monte Fantasia M.D.   On: 03/04/2019 20:04   Dg Lumbar Spine 2-3 Views  Result Date: 03/04/2019 CLINICAL DATA:  Weakness.  Chronic back pain EXAM: LUMBAR SPINE - 2-3 VIEW COMPARISON:  None. FINDINGS: Negative for fracture or endplate erosion. Generally preserved disc height with mild endplate ridging. Degenerative facet spurring at L3-4 and below. Percutaneous gastrostomy tube.  Atherosclerotic calcification IMPRESSION: No acute finding.  Ordinary degenerative changes. Electronically Signed   By: Monte Fantasia M.D.   On: 03/04/2019 20:02   Dg Chest Portable 1 View  Result Date: 03/04/2019 CLINICAL DATA:  Weakness EXAM: PORTABLE CHEST 1 VIEW COMPARISON:  May 08, 2018 FINDINGS: Lungs are hyperexpanded. No edema or consolidation. Heart size and pulmonary vascularity are normal. No adenopathy. Gastrostomy catheter positioned in left upper quadrant. IMPRESSION: Lungs hyperexpanded. No edema or consolidation. Cardiac silhouette within normal limits. No adenopathy evident. Electronically Signed   By: Lowella Grip III M.D.   On: 03/04/2019 15:34    Scheduled Meds: . docusate sodium  100 mg Oral BID  . free water  120 mL Per Tube Q4H  . heparin  5,000 Units Subcutaneous Q8H  . lactulose  10 g Per Tube Daily  . tiotropium  18 mcg Inhalation Daily   Continuous Infusions: . dextrose 75 mL (03/05/19 0943)  . feeding supplement (OSMOLITE 1.5 CAL)       Assessment/Plan:  1. Severe protein calorie malnutrition.  He has been unable to tolerate feeds at home.  He is a high risk for refeeding syndrome.  Case discussed with nutritionist to start tube feedings continuous. 2. Hypernatremia, hypercalcemia.  Case discussed with dietitian to try to increase free water via the PEG tube.  Give IV fluids with D5W currently.  Send off PTH related protein, serum protein electrophoresis, PTH, vitamin D level 3. Hypokalemia.  Replace potassium and IV fluids. 4. Chronic pain.  Start liquid pain medication down the tube.  Patient on IV morphine also as needed. 5. Chronic constipation start low-dose lactulose via the tube. 6. 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  Tube feeding for nutrition.  Patient is n.p.o. 7. Weakness physical therapy evaluation. 8. Weight loss.  We will try tube feeding today.  May end up needing a CAT scan of the chest abdomen pelvis to rule out cancerous process but I will hold off on that right now.  Check a PSA. 9. Lactic acidosis likely secondary to dehydration.  IV fluid hydration.  Code Status:     Code Status Orders  (From admission, onward)         Start     Ordered   03/05/19 0200  Do not attempt resuscitation (DNR)  Continuous    Question Answer Comment  In the event of cardiac or respiratory ARREST Do not call a "code blue"   In the event of cardiac or respiratory ARREST Do not perform Intubation, CPR, defibrillation or ACLS   In the event of cardiac or respiratory ARREST Use medication by any route, position, wound care, and other measures to relive pain and suffering. May use oxygen, suction and manual treatment of airway obstruction as needed for comfort.      03/05/19 0159        Code Status History    Date Active Date Inactive Code Status Order ID Comments User Context   03/04/2019 2124 03/05/2019 0159 Full Code [id number]  Max Sane, MD Inpatient   03/04/2019 1729 03/04/2019  2124 DNR [id number]  Max Sane, MD ED   Advance Care Planning Activity     Family Communication: Spoke with sister on the phone Disposition Plan: To be determined  Consultants:  Palliative care  Time spent: 35 minutes  [place]

## 2019-03-05 NOTE — Progress Notes (Signed)
   03/05/19 1600  Clinical Encounter Type  Visited With Patient;Health care provider  Visit Type Initial  Referral From Nurse;Care management  Stress Factors  Patient Stress Factors Exhausted;Health changes   Chaplain received a referral from the nurse case manager to visit/support the patient. Upon arrival, the patient was sitting up in bed and appeared to be a bit drowsy. The patient reported that he was feeling "a little better." He shared that he has not had a bite of food, nor been able to go to church in the past 6 months. The patient confirmed that this has been a very trying time. The patient reported that he did not anticipate going home soon and would also welcome a visit tomorrow. This chaplain provided support in the form of active and reflective listening, compassionate ministerial presence, and encouragement.

## 2019-03-05 NOTE — Progress Notes (Signed)
Osmolite infusing at 20cc hour without difficulty , patient is tolerating well thus far, HOB at 35 degrees

## 2019-03-05 NOTE — Progress Notes (Signed)
Patient would like password updated to " [place]". Charge Nurse updated. Tolerating TF well at 20cc/hour. No residual noted. GT is patent and intact. No n/v noted. Patient prefers to wet mouth with ice chips and sucks on them then spits them out. Alert and oriented , flat affect. Up to recliner today

## 2019-03-05 NOTE — TOC Initial Note (Signed)
Transition of Care [facility]) - Initial/Assessment Note    Patient Details  Name: Oscar Sanchez MRN: [id number] Date of Birth: 26-May-1945  Transition of [facility]) CM/SW Contact:    Su Hilt, RN Phone Number: 03/05/2019, 3:54 PM  Clinical Narrative:                 Met with the patient to discuss discharge plan and needs He lives alone He manages the peg tube feedings on his own He said doing it once a day is more manageable than  3 or 6 times a day He is agreeable to home health PT, OT, Nursing aide, Nurse and SW, called and set up with [facility] spoke to [place] and they accepted He has a RW and a BSC at home but does not use it, does not want additional DME, He refuses SNF and said he just does not feel it will help him, He does not want to Exercise daily and said he does not have the strength to do so, I explained that is the more reason to do it, he said no. He will agree to home health but that is all.  Expected Discharge Plan: [place] Barriers to Discharge: Continued Medical Work up   Patient Goals and CMS Choice Patient states their goals for this hospitalization and ongoing recovery are:: get better      Expected Discharge Plan and Services Expected Discharge Plan: [place]   Discharge Planning Services: CM Consult                     DME Arranged: Patient refused services         HH Arranged: PT, OT, Nurse's Aide, Social Work, Therapist, sports [place] Agency: [facility] Date HH Agency Contacted: 03/05/19 Time [place]: 1552 Representative spoke with at [place]: [place]  Prior Living Arrangements/Services   Lives with:: Self Patient language and need for interpreter reviewed:: Yes Do you feel safe going back to the place where you live?: Yes      Need for Family Participation in Patient Care: No (Comment) Care giver support system in place?: Yes (comment) Current home services: DME(Rolling walker,  BSC) Criminal Activity/Legal Involvement Pertinent to Current Situation/Hospitalization: No - Comment as needed  Activities of Daily Living Home Assistive Devices/Equipment: None ADL Screening (condition at time of admission) Patient's cognitive ability adequate to safely complete daily activities?: Yes Is the patient deaf or have difficulty hearing?: No Does the patient have difficulty seeing, even when wearing glasses/contacts?: No Does the patient have difficulty concentrating, remembering, or making decisions?: No Patient able to express need for assistance with ADLs?: Yes Does the patient have difficulty dressing or bathing?: Yes Independently performs ADLs?: No Communication: Needs assistance Is this a change from baseline?: Change from baseline, expected to last >3 days Dressing (OT): Needs assistance Is this a change from baseline?: Change from baseline, expected to last >3 days Grooming: Needs assistance Is this a change from baseline?: Change from baseline, expected to last >3 days Feeding: Independent Bathing: Needs assistance Is this a change from baseline?: Change from baseline, expected to last >3 days Toileting: Needs assistance Is this a change from baseline?: Change from baseline, expected to last >3days In/Out Bed: Dependent Is this a change from baseline?: Change from baseline, expected to last >3 days Walks in Home: Needs assistance Is this a change from baseline?: Change from baseline, expected to last >3 days Does  the patient have difficulty walking or climbing stairs?: Yes Weakness of Legs: Both Weakness of Arms/Hands: Both  Permission Sought/Granted   Permission granted to share information with : Yes, Verbal Permission Granted              Emotional Assessment Appearance:: Appears stated age Attitude/Demeanor/Rapport: Engaged Affect (typically observed): Appropriate Orientation: : Oriented to Self, Oriented to Place, Oriented to  Time, Oriented to  Situation Alcohol / Substance Use: Not Applicable Psych Involvement: No (comment)  Admission diagnosis:  Dehydration [E86.0] Low back pain [M54.5] Achalasia [K22.0] Hypernatremia [E87.0] AKI (acute kidney injury) (HCC) [N17.9] Patient Active Problem List   Diagnosis Date Noted  . Protein-calorie malnutrition, severe 03/05/2019  . Severe malnutrition (HCC) 03/05/2019  . Hypernatremia   . Hypercalcemia   . Achalasia   . Hypokalemia   . Acute kidney injury (HCC) 03/04/2019   PCP:  Smith, Kristi M, MD Pharmacy:   [facility] [place], [place] - [address] [place] [address] Phone: [phone] Fax: [phone]     Social Determinants of Health (SDOH) Interventions    Readmission Risk Interventions No flowsheet data found.  

## 2019-03-05 NOTE — Progress Notes (Signed)
Initial Nutrition Assessment  DOCUMENTATION CODES:   Severe malnutrition in context of chronic illness, Underweight  INTERVENTION:  Initiate Osmolite 1.5 Cal at 20 mL/hr and advance by 10 mL/hr every 12 hours to goal rate of 60 mL/hr. Goal regimen provides 2160 kcal, 90 grams of protein, 1094 mL H2O daily.  Provide free water flush of 120 mL Q4hrs. This will provide a total of 1814 mL H2O daily including the water patient will receive in his goal TF regimen.  Goal TF regimen meets 100% RDIs for vitamins/minerals.  Patient is at high risk for refeeding syndrome. Recommend monitoring potassium, phosphorus, and magnesium at least once daily and replacing as needed. Discussed with MD.  If patient tolerates goal TF regimen can begin decreasing number of hours provided over so patient can have time off the pump during the day.  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   -Pending PMT consult.  Met with patient at bedside. He reported his feeding tube was placed 6 months ago but according to chart it was placed on 12/01/2018 by IR. Plan was for optimization of nutrition status (gaining ~30 lbs per pt report) prior to myotomy +  diverticulectomy can be considered. His goal tube feed regimen is Nutren 1.5 Cal (6 cans/day; 1 can every 3 hours). Provides 2250 kcal, 90 grams of protein, 1164 mL H2O daily. He is also supposed to be providing 60 mL free water flush before and after each feed, which would provide a total of 1884 mL H2O daily including water in tube feeding. Tube feed regimen is significantly higher than patient's estimated calorie needs but he reports it is because he was still losing weight on 5 cans per day and did verify this from reading outpatient RD notes. Patient reports he was really only able to tolerate all 6 cans about 3-4 days the whole time his tube has been in. He reports for the past 1.5-2 months he has only been taking 1 can every 3 days due to abdominal pain and nausea. He also struggles with constipation. He reports he has tried to tolerate his bolus feeds but it is too much volume too quickly. Discussed options and patient is open to trying a continuous tube feed via pump. He states it is important to him to keep trying tube feeds so he can gain weight and have his surgery. He is NPO except for ice chips, which he spits out in a cup.  Patient reports his UBW was 150 lbs. According to weight history in chart he was 68.4 kg (150.8 lbs) on 05/03/2018. He is now 43.7 kg (96.34 lbs). He has lost 24.7 kg (36% body weight) over the past 10 months, which is significant for time frame.  Medications reviewed and include: Colace 100 mg BID, D5W at  75 mL/hr.  Labs reviewed: CBG 104, Sodium 154, Potassium 3.4, Chloride 114, BUN 47.  NUTRITION - FOCUSED PHYSICAL EXAM:    Most Recent Value  Orbital Region  Severe depletion  Upper Arm Region  Severe depletion  Thoracic and Lumbar Region  Severe depletion  Buccal Region  Severe depletion  Temple Region  Severe depletion  Clavicle Bone Region  Severe depletion  Clavicle and Acromion Bone Region  Severe depletion  Scapular Bone Region  Severe depletion  Dorsal  Hand  Severe depletion  Patellar Region  Severe depletion  Anterior Thigh Region  Severe depletion  Posterior Calf Region  Severe depletion  Edema (RD Assessment)  None  Hair  Reviewed  Eyes  Reviewed  Mouth  Reviewed  Skin  Reviewed  Nails  Reviewed     Diet Order:   Diet Order            Diet NPO time specified Except for: Sips with Meds  Diet effective now             EDUCATION NEEDS:   No education needs have been identified at this time  Skin:  Skin Assessment: Reviewed RN Assessment  Last BM:  Unknown  Height:   Ht Readings from Last 1 Encounters:  03/04/19 _0  (1.753 m)   Weight:   Wt Readings from Last 1 Encounters:  03/05/19 43.7 kg   Ideal Body Weight:  72.7 kg  BMI:  Body mass index is 14.23 kg/m.  Estimated Nutritional Needs:   Kcal:  1530-1750 (35-40 kcal/kg); though pt receiving approximately 50 kcal/kg for wt gain  Protein:  77-87 grams (1.8-2 grams/kg)  Fluid:  1.5-1.8 L/day  Jacklynn Barnacle, MS, RD, LDN Office: [phone] Pager: [phone] After Hours/Weekend Pager: [phone]

## 2019-03-05 NOTE — Evaluation (Signed)
Occupational Therapy Evaluation Patient Details Name: Oscar Sanchez MRN: [id number] DOB: 04-02-1946 Today's Date: 03/05/2019    History of Present Illness Pt is a 73 y.o. male presenting to hospital 03/04/19 with back pain, nausea, weakness, and difficulty with PEG tube feedings.  Pt admitted with acute kidney injury, hypernatremia, sinus tachycardia, and acute on chronic back pain.  PMH includes esophageal diverticulum and achalasia s/p PEG tube, pulmonary emphysema, dysphagia, COPD, c-spine surgery.   Clinical Impression   Pt seen for OT evaluation this date. Prior to hospital admission, pt was Indep with all aspects of ADLs/IADLs per his report.  Pt lives alone in [facility] with 3 STE.  Currently pt demonstrates impairments in fxl activity tolerance, safety awareness and global strength requiring MIN A with t/f''s and ADL mobility as well as MIN A for some aspects of standing and LB self care.  Pt would benefit from skilled OT to address noted impairments and functional limitations (see below for any additional details) in order to maximize safety and independence while minimizing falls risk and caregiver burden.  Upon hospital discharge, recommend pt discharge to SNF (with potential for [place] d/c if enough support in place).    Follow Up Recommendations  Home health OT;SNF(HHOT versus SNF based on support available for home. Pt adamant about wanting to return home, but demos decreased safety awareness/lack of insight into deficits.)    Equipment Recommendations       Recommendations for Other Services       Precautions / Restrictions Precautions Precautions: Fall Precaution Comments: NPO; PEG tube Restrictions Weight Bearing Restrictions: No      Mobility Bed Mobility Overal bed mobility: Modified Independent             General bed mobility comments: increased time and effort, HOB elevated, some mild use of bed rail  Transfers Overall transfer level: Needs assistance    Transfers: Sit to/from Stand Sit to Stand: Min assist         General transfer comment: pt comes up from bed before OT finishes donning gait belt demo'ing some impulsivity.  Some slight imbalance noted for which MIN A is provided for safety.    Balance Overall balance assessment: Needs assistance Sitting-balance support: No upper extremity supported;Feet supported Sitting balance-Leahy Scale: Good Sitting balance - Comments: demos G static and dynamic sitting balance   Standing balance support: Single extremity supported Standing balance-Leahy Scale: Poor Standing balance comment: pt requires UE support for standing                           ADL either performed or assessed with clinical judgement   ADL Overall ADL's : Needs assistance/impaired Eating/Feeding: Set up Eating/Feeding Details (indicate cue type and reason): setup to wet mouth with ice chips, is otherwise on PEG tube Grooming: Wash/dry hands;Wash/dry face;Set up;Sitting   Upper Body Bathing: Minimal assistance;Sitting   Lower Body Bathing: Minimal assistance;Sit to/from stand Lower Body Bathing Details (indicate cue type and reason): basd on clincial observation. Upper Body Dressing : Minimal assistance;Sitting   Lower Body Dressing: Minimal assistance;Sit to/from stand Lower Body Dressing Details (indicate cue type and reason): MIN A only for dynamic sitting balance while doffing/donning socks. Toilet Transfer: Minimal assistance;Ambulation;BSC;RW   Toileting- Clothing Manipulation and Hygiene: Minimal assistance;Sit to/from stand               Vision Patient Visual Report: No change from baseline       Perception  Praxis      Pertinent Vitals/Pain Pain Assessment: 0-10 Pain Score: 6  Pain Location: in cervical spine area-chronic pain from multiple neck sx's per pt report. Pain Descriptors / Indicators: Aching Pain Intervention(s): Monitored during session     Hand Dominance      Extremity/Trunk Assessment Upper Extremity Assessment Upper Extremity Assessment: Generalized weakness;RUE deficits/detail;LUE deficits/detail RUE Deficits / Details: shoulder 3-/5, elbow & grip 3+/5 LUE Deficits / Details: shoulder 3-/5, elbow & grip 3+/5   Lower Extremity Assessment Lower Extremity Assessment: Defer to PT evaluation;Generalized weakness   Cervical / Trunk Assessment Cervical / Trunk Assessment: Kyphotic(head forward, head down)   Communication Communication Communication: No difficulties   Cognition Arousal/Alertness: Awake/alert Behavior During Therapy: WFL for tasks assessed/performed Overall Cognitive Status: Impaired/Different from baseline Area of Impairment: Safety/judgement                         Safety/Judgement: Decreased awareness of safety;Decreased awareness of deficits     General Comments: appropriately follows all commands, but some impulsivity noted with commands during sup to sit, and sit<>stand   General Comments       Exercises Other Exercises Other Exercises: OT facilitates education re: role of OT in acute setting. Pt verbalized understanding. Other Exercises: OT facilitates education re: safety and fall prevention including education re: use of call light and attention to bed alarm which is in place for pt safety. Pt requires reinforcement re: safety and fall prevention education.   Shoulder Instructions      Home Living Family/patient expects to be discharged to:: Private residence Living Arrangements: Alone Available Help at Discharge: Other (Comment)(apparently, per visitor present in room (states she is a family friend), he has sister that tries to check on him, but he will not allow her into his home currently citing not wanting her to see his home in current state.) Type of Home: House Home Access: Stairs to enter [employer] of Steps: mostly uses back entrance (3 STE with B railing) but also uses front  entrance (4 STE with B railing) Entrance Stairs-Rails: Right;Left;Can reach both Home Layout: One level     Bathroom Shower/Tub: Teacher, early years/pre: Standard     Home Equipment: Walker - 2 wheels;Grab bars - tub/shower          Prior Functioning/Environment Level of Independence: Independent        Comments: Pt reports no falls in past 6 months.        OT Problem List: Decreased strength;Decreased range of motion;Decreased activity tolerance;Impaired balance (sitting and/or standing);Decreased safety awareness;Decreased knowledge of use of DME or AE;Decreased knowledge of precautions;Pain      OT Treatment/Interventions: Self-care/ADL training;Therapeutic exercise;Energy conservation;DME and/or AE instruction;Therapeutic activities;Patient/family education;Balance training    OT Goals(Current goals can be found in the care plan section) Acute Rehab OT Goals Patient Stated Goal: to improve pain and mobility OT Goal Formulation: With patient Time For Goal Achievement: 03/19/19 Potential to Achieve Goals: Good  OT Frequency: Min 2X/week   Barriers to D/C:            Co-evaluation              AM-PAC OT "6 Clicks" Daily Activity     Outcome Measure Help from another person eating meals?: None Help from another person taking care of personal grooming?: A Little Help from another person toileting, which includes using toliet, bedpan, or urinal?: A Little Help from another person  bathing (including washing, rinsing, drying)?: A Little Help from another person to put on and taking off regular upper body clothing?: A Little Help from another person to put on and taking off regular lower body clothing?: A Little 6 Click Score: 19   End of Session Equipment Utilized During Treatment: Gait belt;Rolling walker Nurse Communication: Other (comment)(nurse informed that pt wants to setup MyChart)  Activity Tolerance: Patient tolerated treatment  well Patient left: in bed;with call bell/phone within reach;with bed alarm set  OT Visit Diagnosis: Unsteadiness on feet (R26.81);Muscle weakness (generalized) (M62.81)                Time: [id number] OT Time Calculation (min): 42 min Charges:  OT General Charges $OT Visit: 1 Visit OT Evaluation $OT Eval Moderate Complexity: 1 Mod OT Treatments $Self Care/Home Management : 8-22 mins $Therapeutic Activity: 8-22 mins  Gerrianne Scale, MS, OTR/L ascom [phone] 03/05/19, 3:41 PM

## 2019-03-06 ENCOUNTER — Inpatient Hospital Stay: Payer: Medicare PPO

## 2019-03-06 DIAGNOSIS — C787 Secondary malignant neoplasm of liver and intrahepatic bile duct: Secondary | ICD-10-CM

## 2019-03-06 DIAGNOSIS — R1900 Intra-abdominal and pelvic swelling, mass and lump, unspecified site: Secondary | ICD-10-CM

## 2019-03-06 LAB — CBC
HCT: 37.1 % — ABNORMAL LOW (ref 39.0–52.0)
Hemoglobin: 11.7 g/dL — ABNORMAL LOW (ref 13.0–17.0)
MCH: 28 pg (ref 26.0–34.0)
MCHC: 31.5 g/dL (ref 30.0–36.0)
MCV: 88.8 fL (ref 80.0–100.0)
Platelets: 158 10*3/uL (ref 150–400)
RBC: 4.18 MIL/uL — ABNORMAL LOW (ref 4.22–5.81)
RDW: 13.8 % (ref 11.5–15.5)
WBC: 10.7 10*3/uL — ABNORMAL HIGH (ref 4.0–10.5)
nRBC: 0 % (ref 0.0–0.2)

## 2019-03-06 LAB — GLUCOSE, CAPILLARY
Glucose-Capillary: 171 mg/dL — ABNORMAL HIGH (ref 70–99)
Glucose-Capillary: 164 mg/dL — ABNORMAL HIGH (ref 70–99)
Glucose-Capillary: 92 mg/dL (ref 70–99)
Glucose-Capillary: 175 mg/dL — ABNORMAL HIGH (ref 70–99)
Glucose-Capillary: 150 mg/dL — ABNORMAL HIGH (ref 70–99)

## 2019-03-06 LAB — BASIC METABOLIC PANEL
Anion gap: 8 (ref 5–15)
BUN: 30 mg/dL — ABNORMAL HIGH (ref 8–23)
CO2: 28 mmol/L (ref 22–32)
Calcium: 10.6 mg/dL — ABNORMAL HIGH (ref 8.9–10.3)
Chloride: 110 mmol/L (ref 98–111)
Creatinine, Ser: 1.01 mg/dL (ref 0.61–1.24)
GFR calc Af Amer: >60 mL/min (ref >60)
GFR calc non Af Amer: >60 mL/min (ref >60)
Glucose, Bld: 188 mg/dL — ABNORMAL HIGH (ref 70–99)
Potassium: 3.5 mmol/L (ref 3.5–5.1)
Sodium: 146 mmol/L — ABNORMAL HIGH (ref 135–145)

## 2019-03-06 LAB — VITAMIN B12: Vitamin B-12: 1161 pg/mL — ABNORMAL HIGH (ref 180–914)

## 2019-03-06 LAB — MAGNESIUM: Magnesium: 2.3 mg/dL (ref 1.7–2.4)

## 2019-03-06 LAB — VITAMIN D 25 HYDROXY (VIT D DEFICIENCY, FRACTURES): Vit D, 25-Hydroxy: 28.4 ng/mL — ABNORMAL LOW (ref 30–100)

## 2019-03-06 LAB — PROCALCITONIN: Procalcitonin: <0.1 ng/mL

## 2019-03-06 LAB — PHOSPHORUS: Phosphorus: 2 mg/dL — ABNORMAL LOW (ref 2.5–4.6)

## 2019-03-06 LAB — TSH: TSH: 0.246 u[IU]/mL — ABNORMAL LOW (ref 0.350–4.500)

## 2019-03-06 MED ORDER — FENTANYL 12 MCG/HR TD PT72
1.0000 | MEDICATED_PATCH | TRANSDERMAL | Status: DC
  Administered 2019-03-06 – 2019-03-12 (×3): 1 via TRANSDERMAL
  Filled 2019-03-06 (×5): qty 1

## 2019-03-06 MED ORDER — ENOXAPARIN SODIUM 30 MG/0.3ML ~~LOC~~ SOLN: 30.0000 mg | SUBCUTANEOUS | Status: DC

## 2019-03-06 MED ORDER — IOHEXOL 9 MG/ML PO SOLN
500.0000 mL | ORAL | Status: AC
  Administered 2019-03-06 (×2): 500 mL via ORAL

## 2019-03-06 MED ORDER — PANTOPRAZOLE SODIUM 40 MG PO TBEC
40.0000 mg | DELAYED_RELEASE_TABLET | Freq: Every day | ORAL | Status: DC
  Administered 2019-03-08 – 2019-03-09 (×2): 40 mg via ORAL
  Filled 2019-03-06 (×2): qty 1

## 2019-03-06 MED ORDER — IOHEXOL 300 MG/ML  SOLN
75.0000 mL | Freq: Once | INTRAMUSCULAR | Status: AC | PRN
  Administered 2019-03-06: 12:00:00 75 mL via INTRAVENOUS

## 2019-03-06 MED ORDER — POTASSIUM PHOSPHATES 15 MMOLE/5ML IV SOLN
8.0000 mmol | Freq: Once | INTRAVENOUS | Status: AC
  Administered 2019-03-06: 8 mmol via INTRAVENOUS
  Filled 2019-03-06: qty 2.67

## 2019-03-06 NOTE — Progress Notes (Signed)
PALLIATIVE NOTE:  Palliative consult received for [place] discussion. Of note patient is out of the room undergoing CT CAP to rule out possible malignancy. Will attempt to see later today if time permits based on high volume of consults or tomorrow for goals of care.   Detailed notes and recommendations to follow completed [place] discussion.   Thank you for your referral and allowing Palliative to assist in Mr. Luria care.   Alda Lea, AGPCNP-BC Palliative Medicine Team   NO CHARGE

## 2019-03-06 NOTE — Progress Notes (Signed)
   03/06/19 1600  Clinical Encounter Type  Visited With Patient  Visit Type Follow-up  Referral From Nurse  Stress Factors  Patient Stress Factors Exhausted;Health changes   Chaplain followed up with the patient today from yesterday's conversation. Upon arrival, the patient was resting with the lights on. He was easily awakened to verbal stimuli. The patient reported that he was tired and weak, but welcomed the follow-up visit. During the visit, the patient's nurse came in to apply a Fentanyl patch to address his pain. The patient is a person of strong [place] faith who is utilizing his faith to understand and cope with the uncertainty of his present illness and the suffering he is enduring. The patient shared that he has had a challenging life, but has overcome much. 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 The patient ascribes to the philosophy that if his gift(s) and work can't bless someone else, there is no need for it. The patient is preparing for additional testing and continues to put his faith and trust in God throughout this trying time. This chaplain provided support in the form of active and reflective listening, compassionate ministerial presence, encouragement, prayer, and theological reflection. Follow-up visits are welcomed.

## 2019-03-06 NOTE — Progress Notes (Signed)
Patient ID: Oscar Sanchez, male   DOB: 1945/09/06, 73 y.o.   MRN: [id number] [facility] PROGRESS NOTE  Kenzie Pastran [id number] DOB: 1945/12/14 DOA: 03/04/2019 PCP: Wardell Honour, MD  HPI/Subjective: Patient complains of back and neck pain.  He states he has pain in his right ribs going down both of his legs.  Has pain up in his left shoulder going down his back and down his left leg.  He has not been able to tolerate his feeding at home.  He came in dehydrated with electrolyte abnormalities.  At times he goes on long time without a bowel movement.  Objective: Vitals:   03/06/19 0339 03/06/19 0848  BP: 100/61 (!) 97/58  Pulse: 76 74  Resp: 20 18  Temp: 98.2 F (36.8 C) 98.6 F (37 C)  SpO2: 96% 96%    Intake/Output Summary (Last 24 hours) at 03/06/2019 1536 Last data filed at 03/06/2019 0600 Gross per 24 hour  Intake 2163.15 ml  Output -  Net 2163.15 ml   Filed Weights   03/04/19 2114 03/05/19 0500 03/06/19 0500  Weight: 42.1 kg 43.7 kg 43.8 kg    ROS: Review of Systems  Constitutional: Negative for chills and fever.  Eyes: Negative for blurred vision.  Respiratory: Negative for cough and shortness of breath.   Cardiovascular: Negative for chest pain.  Gastrointestinal: Negative for abdominal pain, constipation, diarrhea, nausea and vomiting.  Genitourinary: Negative for dysuria.  Musculoskeletal: Positive for back pain, joint pain and neck pain.  Neurological: Negative for dizziness and headaches.   Exam: Physical Exam  Constitutional: He is oriented to person, place, and time.  HENT:  Nose: No mucosal edema.  Mouth/Throat: No oropharyngeal exudate or posterior oropharyngeal edema.  Eyes: Pupils are equal, round, and reactive to light. Conjunctivae, EOM and lids are normal.  Neck: No JVD present. Carotid bruit is not present. No edema present. No thyroid mass and no thyromegaly present.  Cardiovascular: S1 normal and S2 normal. Exam reveals no gallop.   No murmur heard. Pulses:      Dorsalis pedis pulses are 2+ on the right side and 2+ on the left side.  Respiratory: No respiratory distress. He has no wheezes. He has no rhonchi. He has no rales.  GI: Soft. Bowel sounds are normal. There is no abdominal tenderness.  Musculoskeletal:     Right ankle: He exhibits no swelling.     Left ankle: He exhibits no swelling.  Lymphadenopathy:    He has no cervical adenopathy.  Neurological: He is alert and oriented to person, place, and time. No cranial nerve deficit.  Able to straight leg raise  Skin: Skin is warm. No rash noted. Nails show no clubbing.  Psychiatric: He has a normal mood and affect.      Data Reviewed: Basic Metabolic Panel: Recent Labs  Lab 03/04/19 1412 03/05/19 0525 03/06/19 0345  NA 152* 154* 146*  K 3.6 3.4* 3.5  CL 105 114* 110  CO2 24 26 28   GLUCOSE 158* 123* 188*  BUN 56* 47* 30*  CREATININE 1.54* 1.14 1.01  CALCIUM 12.6* 11.0* 10.6*  MG  --   --  2.3  PHOS  --   --  2.0*   Liver Function Tests: Recent Labs  Lab 03/05/19 0525  AST 17  ALT 14  ALKPHOS 68  BILITOT 1.0  PROT 7.7  ALBUMIN 3.3*   CBC: Recent Labs  Lab 03/04/19 1412 03/05/19 0525 03/06/19 0345  WBC 19.3* 13.7* 10.7*  HGB 16.4 13.0 11.7*  HCT 50.2 40.5 37.1*  MCV 85.2 86.5 88.8  PLT 276 195 158    CBG: Recent Labs  Lab 03/05/19 0743 03/05/19 2358 03/06/19 0339 03/06/19 0846 03/06/19 1146  GLUCAP 104* 127* 171* 164* 92    Recent Results (from the past 240 hour(s))  Culture, blood (routine x 2)     Status: None (Preliminary result)   Collection Time: 03/04/19  3:15 PM   Specimen: BLOOD  Result Value Ref Range Status   Specimen Description BLOOD RIGHT ANTECUBITAL  Final   Special Requests   Final    BOTTLES DRAWN AEROBIC AND ANAEROBIC Blood Culture results may not be optimal due to an inadequate volume of blood received in culture bottles   Culture   Final    NO GROWTH 2 DAYS Performed at [facility],  [address], [place], [place] [address]    Report Status PENDING  Incomplete  Culture, blood (routine x 2)     Status: None (Preliminary result)   Collection Time: 03/04/19  5:42 PM   Specimen: BLOOD  Result Value Ref Range Status   Specimen Description BLOOD FOREARM  Final   Special Requests   Final    BOTTLES DRAWN AEROBIC AND ANAEROBIC Blood Culture adequate volume   Culture   Final    NO GROWTH 2 DAYS Performed at [facility], [address] [place], [place] [address]    Report Status PENDING  Incomplete  SARS CORONAVIRUS 2 (TAT 6-24 HRS) Nasopharyngeal Nasopharyngeal Swab     Status: None   Collection Time: 03/04/19  6:25 PM   Specimen: Nasopharyngeal Swab  Result Value Ref Range Status   SARS Coronavirus 2 NEGATIVE NEGATIVE Final    Comment: (NOTE) SARS-CoV-2 target nucleic acids are NOT DETECTED. The SARS-CoV-2 RNA is generally detectable in upper and lower respiratory specimens during the acute phase of infection. Negative results do not preclude SARS-CoV-2 infection, do not rule out co-infections with other pathogens, and should not be used as the sole basis for treatment or other patient management decisions. Negative results must be combined with clinical observations, patient history, and epidemiological information. The expected result is Negative. Fact Sheet for Patients: SugarRoll.be Fact Sheet for Healthcare Providers: https://www.woods-mathews.com/ This test is not yet approved or cleared by the [place] FDA and  has been authorized for detection and/or diagnosis of SARS-CoV-2 by FDA under an Emergency Use Authorization (EUA). This EUA will remain  in effect (meaning this test can be used) for the duration of the COVID-19 declaration under Section 56 4(b)(1) of the Act, 21 U.S.C. section 360bbb-3(b)(1), unless the authorization is terminated or revoked sooner. Performed at [facility],  [place] [address], [place], [place] [address]      Studies: Dg Thoracic Spine 2 View  Result Date: 03/04/2019 CLINICAL DATA:  Diffuse back pain.  Generalized weakness EXAM: THORACIC SPINE 2 VIEWS COMPARISON:  None. FINDINGS: Negative for fracture or listhesis. Mild lower thoracic and midthoracic endplate spurring. No evidence of erosion or bone lesion. C5-6 and C6-7 interbody arthrodesis with multilevel laminectomy involving lower cervical and upper lumbar spine. There is exaggerated kyphosis at the cervicothoracic junction. Maintained posterior mediastinal fat planes. IMPRESSION: 1. No acute or focal finding. 2. Lower cervical ACDF and laminectomy with kyphosis at the cervicothoracic junction. Electronically Signed   By: Monte Fantasia M.D.   On: 03/04/2019 20:04   Dg Lumbar Spine 2-3 Views  Result Date: 03/04/2019 CLINICAL DATA:  Weakness.  Chronic back pain EXAM: LUMBAR SPINE - 2-3 VIEW COMPARISON:  None. FINDINGS: Negative for fracture or endplate erosion. Generally preserved disc height with mild endplate ridging. Degenerative facet spurring at L3-4 and below. Percutaneous gastrostomy tube.  Atherosclerotic calcification IMPRESSION: No acute finding. Ordinary degenerative changes. Electronically Signed   By: Monte Fantasia M.D.   On: 03/04/2019 20:02   Ct Chest W Contrast  Result Date: 03/06/2019 CLINICAL DATA:  Unintended weight loss. Nonlocalized abdominal pain. Technologist notes state COPD, progressive dysphagia, esophageal diverticula and achalasia. G-tube placement 12/01/2018. Admitted for acute on chronic back pain malnutrition and anemia. EXAM: CT CHEST, ABDOMEN, AND PELVIS WITH CONTRAST TECHNIQUE: Multidetector CT imaging of the chest, abdomen and pelvis was performed following the standard protocol during bolus administration of intravenous contrast. CONTRAST:  54mL OMNIPAQUE IOHEXOL 300 MG/ML  SOLN COMPARISON:  Recent chest and spine radiographs, no prior cross-sectional imaging.  FINDINGS: CT CHEST FINDINGS Cardiovascular: Atherosclerosis and tortuosity of the thoracic aorta. Irregular plaque in the distal descending thoracic aorta. No dissection or acute aortic syndrome. Heart is normal in size. No pericardial effusion. There are coronary artery calcifications. No filling defects in the central most pulmonary arteries to the lobar level to suggest pulmonary embolus. Mediastinum/Nodes: Dilated fluid-filled esophagus consistent with achalasia. Small focal outpouching from the distal esophagus, series 2, image 44, likely represents esophageal diverticulum. No inflammatory change or perforation. There is irregular wall thickening of the distal esophagus just proximal to the gastroesophageal junction. Borderline prevascular nodes measuring up to 10 mm. Small upper right paratracheal nodes, all subcentimeter. No enlarged hilar lymph nodes. No visualized thyroid nodule. Lungs/Pleura: Mild emphysema. Scattered areas of subpleural scarring in both lower lobes. Patchy area of tree in bud opacities in the superior segment of the right lower lobe, series 4, image 83. No pulmonary edema or confluent airspace disease. No pleural fluid. Trachea and mainstem bronchi are patent. No pulmonary mass. Musculoskeletal: There are no acute or suspicious osseous abnormalities. Scattered areas of calcification in the dorsal spinal canal without frank canal impingement. CT ABDOMEN PELVIS FINDINGS Hepatobiliary: There are 2 enhancing lesions in the left lobe of the liver, 17 and 16 mm both series 2, image 55. Punctate hepatic granuloma in the right lobe. Gallbladder physiologically distended, no calcified stone. Common bile duct is dilated at 10 mm. No visualized choledocholithiasis. Pancreas: Mildly dilated proximal pancreatic duct at 4 mm. No obvious focal pancreatic mass. No peripancreatic inflammation. Spleen: Normal in size. Splenule anteriorly. No evidence of focal lesion, allowing for arterial phase imaging.  Adrenals/Urinary Tract: Normal right adrenal gland. Left adrenal thickening without dominant nodule. Hydronephrosis. There is bilateral perinephric edema. Homogeneous enhancement with symmetric excretion on delayed phase imaging. No evidence of focal renal mass. Urinary bladder is partially distended. No obvious bladder wall thickening. Stomach/Bowel: Gastrostomy tube with balloon appropriately positioned in the stomach. Low density abutting the gastric lesser curvature is felt to represent necrotic node rather than gastric diverticulum. No bowel obstruction. Administered enteric contrast reaches the colon. No bowel wall thickening or inflammatory change. No evidence of colonic mass. Normal appendix. Vascular/Lymphatic: Abnormal bulky retroperitoneal adenopathy extending from the level of the renal vasculature to the iliac bifurcation. Some of these nodes demonstrate central low density consistent with necrosis. Representative nodal conglomerate in the left periaortic station at the level just below the renal veins measures 4.2 x 2.3 cm, series 2, image 74. Round soft tissue density with central low-density the upper abdomen abutting the gastric cardia/lesser curvature felt to represent a necrotic node  measuring 2.0 x 1.9 cm, series 2, image 57. There are additional enlarged nodes in the porta hepatis and gastrohepatic ligament. No definite pelvic adenopathy. Advanced aortic atherosclerosis with irregular plaque in the mid aorta causing approximately 50% luminal narrowing. Limited assessment for portal vein and IVC patency given phase of contrast, however on delayed phase imaging, no obvious venous thrombus is seen. Reproductive: Prominent prostate gland spans 5.3 cm. Central prostatic calcifications. No obvious prostatic mass. Other: Bilateral retroperitoneal stranding, left greater than right. No significant ascites. No definite omental nodularity. No free air. Musculoskeletal: There are no acute or suspicious  osseous abnormalities. Degenerative change in the lumbar spine. IMPRESSION: 1. Bulky retroperitoneal adenopathy, some of which appears necrotic with central low density. There also 2 enhancing lesions in the left lobe of the liver, in this setting are suspicious for metastatic disease. Primary malignancy not definitively characterize. Patient has achalasia, with irregular wall thickening of the distal esophagus, which could be a source of primary malignancy. Consider further evaluation with endoscopy. Recommend oncologic referral. 2. Borderline prevascular nodes at 10 mm, nonspecific. 3. Common bile duct dilatation at 10 mm with prominence of the pancreatic duct of 4 mm. No obvious pancreatic mass or cause of obstruction. This could be further evaluated with MRCP, if patient is able to tolerate breath hold technique. 4. Thoracoabdominal aortic atherosclerosis with irregular plaque of the distal descending thoracic and infrarenal abdominal aorta. 5. Emphysema. 6. Mild patchy tree in bud opacities in the right lower lobe, likely infectious or inflammatory. 7. Bilateral perinephric edema which is nonspecific, and may be chronic. Aortic Atherosclerosis (ICD10-I70.0) and Emphysema (ICD10-J43.9). Electronically Signed   By: Keith Rake M.D.   On: 03/06/2019 14:58   Ct Abdomen Pelvis W Contrast  Result Date: 03/06/2019 CLINICAL DATA:  Unintended weight loss. Nonlocalized abdominal pain. Technologist notes state COPD, progressive dysphagia, esophageal diverticula and achalasia. G-tube placement 12/01/2018. Admitted for acute on chronic back pain malnutrition and anemia. EXAM: CT CHEST, ABDOMEN, AND PELVIS WITH CONTRAST TECHNIQUE: Multidetector CT imaging of the chest, abdomen and pelvis was performed following the standard protocol during bolus administration of intravenous contrast. CONTRAST:  45mL OMNIPAQUE IOHEXOL 300 MG/ML  SOLN COMPARISON:  Recent chest and spine radiographs, no prior cross-sectional imaging.  FINDINGS: CT CHEST FINDINGS Cardiovascular: Atherosclerosis and tortuosity of the thoracic aorta. Irregular plaque in the distal descending thoracic aorta. No dissection or acute aortic syndrome. Heart is normal in size. No pericardial effusion. There are coronary artery calcifications. No filling defects in the central most pulmonary arteries to the lobar level to suggest pulmonary embolus. Mediastinum/Nodes: Dilated fluid-filled esophagus consistent with achalasia. Small focal outpouching from the distal esophagus, series 2, image 44, likely represents esophageal diverticulum. No inflammatory change or perforation. There is irregular wall thickening of the distal esophagus just proximal to the gastroesophageal junction. Borderline prevascular nodes measuring up to 10 mm. Small upper right paratracheal nodes, all subcentimeter. No enlarged hilar lymph nodes. No visualized thyroid nodule. Lungs/Pleura: Mild emphysema. Scattered areas of subpleural scarring in both lower lobes. Patchy area of tree in bud opacities in the superior segment of the right lower lobe, series 4, image 83. No pulmonary edema or confluent airspace disease. No pleural fluid. Trachea and mainstem bronchi are patent. No pulmonary mass. Musculoskeletal: There are no acute or suspicious osseous abnormalities. Scattered areas of calcification in the dorsal spinal canal without frank canal impingement. CT ABDOMEN PELVIS FINDINGS Hepatobiliary: There are 2 enhancing lesions in the left lobe of the liver, 17 and 16  mm both series 2, image 55. Punctate hepatic granuloma in the right lobe. Gallbladder physiologically distended, no calcified stone. Common bile duct is dilated at 10 mm. No visualized choledocholithiasis. Pancreas: Mildly dilated proximal pancreatic duct at 4 mm. No obvious focal pancreatic mass. No peripancreatic inflammation. Spleen: Normal in size. Splenule anteriorly. No evidence of focal lesion, allowing for arterial phase imaging.  Adrenals/Urinary Tract: Normal right adrenal gland. Left adrenal thickening without dominant nodule. Hydronephrosis. There is bilateral perinephric edema. Homogeneous enhancement with symmetric excretion on delayed phase imaging. No evidence of focal renal mass. Urinary bladder is partially distended. No obvious bladder wall thickening. Stomach/Bowel: Gastrostomy tube with balloon appropriately positioned in the stomach. Low density abutting the gastric lesser curvature is felt to represent necrotic node rather than gastric diverticulum. No bowel obstruction. Administered enteric contrast reaches the colon. No bowel wall thickening or inflammatory change. No evidence of colonic mass. Normal appendix. Vascular/Lymphatic: Abnormal bulky retroperitoneal adenopathy extending from the level of the renal vasculature to the iliac bifurcation. Some of these nodes demonstrate central low density consistent with necrosis. Representative nodal conglomerate in the left periaortic station at the level just below the renal veins measures 4.2 x 2.3 cm, series 2, image 74. Round soft tissue density with central low-density the upper abdomen abutting the gastric cardia/lesser curvature felt to represent a necrotic node measuring 2.0 x 1.9 cm, series 2, image 57. There are additional enlarged nodes in the porta hepatis and gastrohepatic ligament. No definite pelvic adenopathy. Advanced aortic atherosclerosis with irregular plaque in the mid aorta causing approximately 50% luminal narrowing. Limited assessment for portal vein and IVC patency given phase of contrast, however on delayed phase imaging, no obvious venous thrombus is seen. Reproductive: Prominent prostate gland spans 5.3 cm. Central prostatic calcifications. No obvious prostatic mass. Other: Bilateral retroperitoneal stranding, left greater than right. No significant ascites. No definite omental nodularity. No free air. Musculoskeletal: There are no acute or suspicious  osseous abnormalities. Degenerative change in the lumbar spine. IMPRESSION: 1. Bulky retroperitoneal adenopathy, some of which appears necrotic with central low density. There also 2 enhancing lesions in the left lobe of the liver, in this setting are suspicious for metastatic disease. Primary malignancy not definitively characterize. Patient has achalasia, with irregular wall thickening of the distal esophagus, which could be a source of primary malignancy. Consider further evaluation with endoscopy. Recommend oncologic referral. 2. Borderline prevascular nodes at 10 mm, nonspecific. 3. Common bile duct dilatation at 10 mm with prominence of the pancreatic duct of 4 mm. No obvious pancreatic mass or cause of obstruction. This could be further evaluated with MRCP, if patient is able to tolerate breath hold technique. 4. Thoracoabdominal aortic atherosclerosis with irregular plaque of the distal descending thoracic and infrarenal abdominal aorta. 5. Emphysema. 6. Mild patchy tree in bud opacities in the right lower lobe, likely infectious or inflammatory. 7. Bilateral perinephric edema which is nonspecific, and may be chronic. Aortic Atherosclerosis (ICD10-I70.0) and Emphysema (ICD10-J43.9). Electronically Signed   By: Keith Rake M.D.   On: 03/06/2019 14:58    Scheduled Meds: . docusate sodium  100 mg Oral BID  . free water  120 mL Per Tube Q4H  . [START ON 03/07/2019] pantoprazole  40 mg Oral Daily  . tiotropium  18 mcg Inhalation Daily   Continuous Infusions: . dextrose 5 % with KCl 20 mEq / L 75 mL/hr at 03/06/19 0600  . feeding supplement (OSMOLITE 1.5 CAL) 30 mL/hr at 03/06/19 0600  . potassium PHOSPHATE IVPB (  in mmol)      Assessment/Plan:  1. Likely metastatic cancer with liver lesions retroperitoneal necrotic nodes and thickened esophagus.  Case discussed with patient, oncology, sister, interventional radiology.  We will proceed with CT-guided biopsy of retroperitoneal lymph node  tomorrow.  Patient already a DNR.  With his nutritional status he is not the best candidate for systemic chemotherapy.   2. Severe protein calorie malnutrition.  He has been unable to tolerate feeds at home.  He is a high risk for refeeding syndrome.   3. Hypernatremia, hypercalcemia.  Case discussed with dietitian to try to increase free water via the PEG tube.  Give IV fluids with D5W currently.  Follow-up PTH related protein, serum protein electrophoresis, PTH.  Vitamin D level slightly low. 4. Hypokalemia.  Replace potassium and IV fluids. 5. Hypophosphatemia replace phosphorus IV 6. Chronic pain.  Start liquid pain medication down the tube.  Patient on IV morphine also as needed.  Add low-dose fentanyl patch 7. Chronic constipation.  Had bowel movements today.  Hold on lactulose 8. 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  Tube feeding for nutrition.  Patient is n.p.o. on CT scan had thickening of the esophagus. 9. Weakness physical therapy evaluation. 10. Lactic acidosis likely secondary to dehydration.  IV fluid hydration.  Code Status:     Code Status Orders  (From admission, onward)         Start     Ordered   03/05/19 0200  Do not attempt resuscitation (DNR)  Continuous    Question Answer Comment  In the event of cardiac or respiratory ARREST Do not call a "code blue"   In the event of cardiac or respiratory ARREST Do not perform Intubation, CPR, defibrillation or ACLS   In the event of cardiac or respiratory ARREST Use medication by any route, position, wound care, and other measures to relive pain and suffering. May use oxygen, suction and manual treatment of airway obstruction as needed for comfort.      03/05/19 0159        Code Status History    Date Active Date Inactive Code Status Order ID Comments User Context   03/04/2019 2124 03/05/2019 0159 Full Code [id number]  Max Sane, MD Inpatient   03/04/2019 1729 03/04/2019 2124 DNR [id number]   Max Sane, MD ED   Advance Care Planning Activity     Family Communication: Spoke with sister on the phone Disposition Plan: To be determined  Consultants:  Palliative care  Time spent: 35 minutes  [place]

## 2019-03-06 NOTE — TOC Progression Note (Signed)
Transition of Care [facility]) - Progression Note    Patient Details  Name: Oscar Sanchez MRN: [id number] Date of Birth: 08-25-1945  Transition of Care [facility]) CM/SW [place], RN Phone Number: 03/06/2019, 9:48 AM  Clinical Narrative:    Notified Brad with [employer] that the patient needs a Tube feeding pump and the feedings at DC   Expected Discharge Plan: [place] Barriers to Discharge: Continued Medical Work up  Expected Discharge Plan and Services Expected Discharge Plan: [place]   Discharge Planning Services: CM Consult                     DME Arranged: Tube feeding pump, Tube feeding         HH Arranged: PT, OT, Nurse's Aide, Social Work, Therapist, sports [place] Agency: [facility] [place] Date [place]: 03/05/19 Time [place]: [place] Representative spoke with at [place]: [place] Interventions    Readmission Risk Interventions No flowsheet data found.

## 2019-03-06 NOTE — Progress Notes (Signed)
Patient returned from CT,. Started his TF to 40cc/hour per dietary order. Flushes well. IV continues to infuse without difficulty .

## 2019-03-06 NOTE — Progress Notes (Signed)
First bottle of oral contrast administered through GT via gravity , patient tolerated well. HOB at 60

## 2019-03-06 NOTE — Progress Notes (Signed)
Patient noted to put [facility] flat , Charge nurse locked out bed to 30 degrees. Patient has new order for Fentanyl patch 87mcg , this writer applied to LUA , secure with Tegaderm. Nothing by GT tonight after Midnight d/t biopsy in the morning

## 2019-03-07 ENCOUNTER — Encounter: Payer: Self-pay | Admitting: Internal Medicine

## 2019-03-07 ENCOUNTER — Inpatient Hospital Stay: Payer: Medicare PPO

## 2019-03-07 DIAGNOSIS — R590 Localized enlarged lymph nodes: Secondary | ICD-10-CM

## 2019-03-07 DIAGNOSIS — K769 Liver disease, unspecified: Secondary | ICD-10-CM

## 2019-03-07 DIAGNOSIS — R634 Abnormal weight loss: Secondary | ICD-10-CM

## 2019-03-07 DIAGNOSIS — K7689 Other specified diseases of liver: Secondary | ICD-10-CM

## 2019-03-07 DIAGNOSIS — N179 Acute kidney failure, unspecified: Principal | ICD-10-CM

## 2019-03-07 DIAGNOSIS — E860 Dehydration: Secondary | ICD-10-CM

## 2019-03-07 DIAGNOSIS — R638 Other symptoms and signs concerning food and fluid intake: Secondary | ICD-10-CM

## 2019-03-07 LAB — GLUCOSE, CAPILLARY
Glucose-Capillary: 141 mg/dL — ABNORMAL HIGH (ref 70–99)
Glucose-Capillary: 129 mg/dL — ABNORMAL HIGH (ref 70–99)
Glucose-Capillary: 115 mg/dL — ABNORMAL HIGH (ref 70–99)
Glucose-Capillary: 166 mg/dL — ABNORMAL HIGH (ref 70–99)

## 2019-03-07 LAB — PROTEIN ELECTROPHORESIS, SERUM
A/G Ratio: 0.8 (ref 0.7–1.7)
Albumin ELP: 2.8 g/dL — ABNORMAL LOW (ref 2.9–4.4)
Alpha-1-Globulin: 0.2 g/dL (ref 0.0–0.4)
Alpha-2-Globulin: 0.9 g/dL (ref 0.4–1.0)
Beta Globulin: 1.3 g/dL (ref 0.7–1.3)
Gamma Globulin: 1.2 g/dL (ref 0.4–1.8)
Globulin, Total: 3.6 g/dL (ref 2.2–3.9)
Total Protein ELP: 6.4 g/dL (ref 6.0–8.5)

## 2019-03-07 LAB — BASIC METABOLIC PANEL
Anion gap: 8 (ref 5–15)
BUN: 19 mg/dL (ref 8–23)
CO2: 27 mmol/L (ref 22–32)
Calcium: 9.3 mg/dL (ref 8.9–10.3)
Chloride: 101 mmol/L (ref 98–111)
Creatinine, Ser: 0.82 mg/dL (ref 0.61–1.24)
GFR calc Af Amer: >60 mL/min (ref >60)
GFR calc non Af Amer: >60 mL/min (ref >60)
Glucose, Bld: 171 mg/dL — ABNORMAL HIGH (ref 70–99)
Potassium: 3.6 mmol/L (ref 3.5–5.1)
Sodium: 136 mmol/L (ref 135–145)

## 2019-03-07 LAB — PROTIME-INR
INR: 1.2 (ref 0.8–1.2)
Prothrombin Time: 15.1 seconds (ref 11.4–15.2)

## 2019-03-07 LAB — PARATHYROID HORMONE, INTACT (NO CA): PTH: 23 pg/mL (ref 15–65)

## 2019-03-07 MED ORDER — FENTANYL CITRATE (PF) 100 MCG/2ML IJ SOLN
INTRAMUSCULAR | Status: AC | PRN
  Administered 2019-03-07 (×2): 25 ug via INTRAVENOUS

## 2019-03-07 MED ORDER — MIDAZOLAM HCL 2 MG/2ML IJ SOLN
INTRAMUSCULAR | Status: AC
  Filled 2019-03-07: qty 4

## 2019-03-07 MED ORDER — SODIUM CHLORIDE 0.9 % IV SOLN
INTRAVENOUS | Status: AC | PRN
  Administered 2019-03-07: 10 mL/h via INTRAVENOUS

## 2019-03-07 MED ORDER — ENOXAPARIN SODIUM 30 MG/0.3ML ~~LOC~~ SOLN
30.0000 mg | SUBCUTANEOUS | Status: DC
  Administered 2019-03-08 – 2019-03-12 (×5): 30 mg via SUBCUTANEOUS
  Filled 2019-03-07 (×5): qty 0.3

## 2019-03-07 MED ORDER — ENOXAPARIN SODIUM 30 MG/0.3ML ~~LOC~~ SOLN: 30.0000 mg | SUBCUTANEOUS | Status: DC

## 2019-03-07 MED ORDER — MIDAZOLAM HCL 2 MG/2ML IJ SOLN
INTRAMUSCULAR | Status: AC | PRN
  Administered 2019-03-07 (×2): 0.5 mg via INTRAVENOUS

## 2019-03-07 MED ORDER — FENTANYL CITRATE (PF) 100 MCG/2ML IJ SOLN
INTRAMUSCULAR | Status: AC
  Filled 2019-03-07: qty 4

## 2019-03-07 NOTE — Progress Notes (Signed)
Occupational Therapy Treatment Patient Details Name: Oscar Sanchez MRN: [id number] DOB: [birth year] Today's Date: 03/07/2019    History of present illness Pt is a 73 y.o. male presenting to hospital 03/04/19 with back pain, nausea, weakness, and difficulty with PEG tube feedings.  Pt admitted with acute kidney injury, hypernatremia, sinus tachycardia, and acute on chronic back pain.  PMH includes esophageal diverticulum and achalasia s/p PEG tube, pulmonary emphysema, dysphagia, COPD, c-spine surgery.   OT comments  Pt seen for OT tx this date to f/u re: fxl activity tolerance and strength required to complete ADLs/ADL mobility. Pt with pain limiting tolerance for therapy participation this date. Pt agreeable to in-bed education re: UE therex with minimal to moderate (red) resistance band with MIN verbal/tactile cues for form/pace. Pt declines any OOB activity or sitting EOB at this time. RN aware that pt wants pain meds and offers to administer after his biopsy. At this time SNF appears most prudent d/c disposition. Will continue to assess and take into consideration, pain control's effect on pt performance/tolerance. Potential for HHOT if enough support in place for pt in home environment.   Follow Up Recommendations  Home health OT;SNF    Equipment Recommendations  3 in 1 bedside commode([facility] is d/c'ed home.)    Recommendations for Other Services      Precautions / Restrictions Precautions Precautions: Fall Precaution Comments: NPO; PEG tube, ice chips allowed Restrictions Weight Bearing Restrictions: No       Mobility Bed Mobility                  Transfers                 General transfer comment: mobility deferred today, pt declines to perform citing pain    Balance       Sitting balance - Comments: pt declines to transition to EOB sitting this date.       Standing balance comment: deferred                           ADL either performed or  assessed with clinical judgement   ADL                                         General ADL Comments: pt refuses participation in self care, bed mobility, or fxl mobility this date citing pain with mobility.     Vision Patient Visual Report: No change from baseline     Perception     Praxis      Cognition Arousal/Alertness: Awake/alert Behavior During Therapy: WFL for tasks assessed/performed Overall Cognitive Status: Within Functional Limits for tasks assessed                                 General Comments: Pt A&O x4, but still with some lack of insight into deficits/needs.        Exercises Other Exercises Other Exercises: OT facilitates education with pt re: UE therex recommendations with light to medium resistance theraband and handout issued. Other Exercises: OT engages pt in one set x10 reps of bicep flexion and tricep extension with MIN/MOD verbal tactile cues for pt to implement self-anchor tehcnique and for form/pace. In addition, OT engages pt in bilateral horizontal abduction. Pt tolerates only moderately well, completing  first 5 of each exercise with good form and gradually declining in form and pace despite OT's cues-citing fatigue.   Shoulder Instructions       General Comments      Pertinent Vitals/ Pain       Pain Assessment: 0-10 Pain Score: 8  Pain Location: in cervical spine area-chronic pain from multiple neck sx's per pt report. Pain Descriptors / Indicators: Aching;Throbbing Pain Intervention(s): Limited activity within patient's tolerance;Monitored during session;Other (comment)(OT called RN to request pain meds, and RN reports holding until after pt's bx, but will administer then.)  Home Living                                          Prior Functioning/Environment              Frequency  Min 2X/week        Progress Toward Goals  OT Goals(current goals can now be found in the care plan  section)  Progress towards OT goals: Progressing toward goals(Today, d/t pain limiting pt's participation, did not make good progress towards most goals, but was able to complete some education re: UE therex to prevent muscle atrophy.)  Acute Rehab OT Goals Patient Stated Goal: to improve pain and mobility OT Goal Formulation: With patient Time For Goal Achievement: 03/19/19 Potential to Achieve Goals: Good  Plan Discharge plan remains appropriate    Co-evaluation                 AM-PAC OT "6 Clicks" Daily Activity     Outcome Measure   Help from another person eating meals?: None Help from another person taking care of personal grooming?: A Little   Help from another person bathing (including washing, rinsing, drying)?: A Little Help from another person to put on and taking off regular upper body clothing?: A Little Help from another person to put on and taking off regular lower body clothing?: A Little 6 Click Score: 16    End of Session    OT Visit Diagnosis: Unsteadiness on feet (R26.81);Muscle weakness (generalized) (M62.81)   Activity Tolerance Patient limited by pain   Patient Left in bed;with call bell/phone within reach;with bed alarm set   Nurse Communication          Time: [id number] OT Time Calculation (min): 18 min  Charges: OT General Charges $OT Visit: 1 Visit OT Treatments $Therapeutic Exercise: 8-22 mins    Gerrianne Scale, [place], OTR/L ascom [phone] 03/07/19, 2:21 PM

## 2019-03-07 NOTE — Consult Note (Signed)
  MEDICATION RELATED CONSULT NOTE - FOLLOW UP   Pharmacy Consult for: Post IR Procedure Consult - Anticoagulant/Antiplatelet PTA/Inpatient Med List Review by Pharmacist Indication: High Bleeding Risk for Procedure  No Known Allergies  Labs: Recent Labs    03/04/19 1412 03/05/19 0525 03/06/19 0345 03/07/19 0345  WBC 19.3* 13.7* 10.7*  --   HGB 16.4 13.0 11.7*  --   HCT 50.2 40.5 37.1*  --   PLT 276 195 158  --   CREATININE 1.54* 1.14 1.01 0.82  MG  --   --  2.3  --   PHOS  --   --  2.0*  --   ALBUMIN  --  3.3*  --   --   PROT  --  7.7  --   --   AST  --  17  --   --   ALT  --  14  --   --   ALKPHOS  --  68  --   --   BILITOT  --  1.0  --   --    Estimated Creatinine Clearance: 52.5 mL/min (by C-G formula based on SCr of 0.82 mg/dL).   Assessment: Pharmacy consulted for resuming Enoxaparin s/p liver biopsy (high bleeding risk). Patient was previously on Enoxaparin 30 mg Q24H.   Goal of Therapy:  To prevent ADEs.   Plan:  1. Will restart enoxaparin 30 mg Q24h tomorrow at 1400.    Rowland Lathe 03/07/2019,1:38 PM

## 2019-03-07 NOTE — Consult Note (Signed)
[place] NOTE  Patient Care Team: Wardell Honour, MD as PCP - General (Family Medicine)  CHIEF COMPLAINTS/PURPOSE OF CONSULTATION: Liver lesions/retroperitoneal adenopathy-weight loss  HISTORY OF PRESENTING ILLNESS: Patient a poor historian/spoke to patient's Sister [name] over the phone.  Oscar Sanchez 73 y.o.  male patient is currently admitted to hospital for poor p.o. intake/dehydration and hyponatremia.  Patient was recently admitted to the hospital at [facility] in August 2020-with poor p.o. intake.  Patient had EGD-found to have severe achalasia; and had PEG tube placed.  Patient was subsequently admitted to hospital in September 2020-acute anemia-hemoglobin dropping from 14-8.  He had repeat EGD-source of bleeding not noted.   Patient was subsequently discharged home.  Patient lives at home by himself in [place].  His sister lives [place]-who along with her husband is patient's primary caregiver.  Patient has no children.   As per the sister, patient has had significant weight loss continue the last few months.  Complains of worsening pain in the back; also of the abdomen.  Intermittent nausea with vomiting.  Worsening fatigue.   Patient has been minimally ambulatory over the last few weeks-because of generalized weakness.   On evaluation emergency room patient was found to be hypernatremic-sodium 154 and a calcium of 11.  Patient status post IV fluids-electrolytes improved.  CT scan-showed 2 liver lesions in the left lobe; and also bulky retrocrural adenopathy concerning for malignancy.  Oncology has been comfortable further evaluation recommendations.   Review of Systems  Constitutional: Positive for malaise/fatigue and weight loss. Negative for chills, diaphoresis and fever.  HENT: Negative for nosebleeds and sore throat.   Eyes: Negative for double vision.  Respiratory: Negative for cough, hemoptysis, sputum production, shortness of breath and wheezing.    Cardiovascular: Negative for chest pain, palpitations, orthopnea and leg swelling.  Gastrointestinal: Positive for abdominal pain, nausea and vomiting. Negative for blood in stool, constipation, diarrhea, heartburn and melena.  Genitourinary: Negative for dysuria, frequency and urgency.  Musculoskeletal: Positive for back pain. Negative for joint pain.  Skin: Negative.  Negative for itching and rash.  Neurological: Positive for tingling. Negative for dizziness, focal weakness, weakness and headaches.  Endo/Heme/Allergies: Does not bruise/bleed easily.  Psychiatric/Behavioral: Negative for depression. The patient is not nervous/anxious and does not have insomnia.      MEDICAL HISTORY:  Past Medical History:  Diagnosis Date  . Esophageal diverticulum     SURGICAL HISTORY: History reviewed. No pertinent surgical history.  SOCIAL HISTORY: Social History   Socioeconomic History  . Marital status: Divorced    Spouse name: Not on file  . Number of children: Not on file  . Years of education: Not on file  . Highest education level: Not on file  Occupational History  . Not on file  Social Needs  . Financial resource strain: Not on file  . Food insecurity    Worry: Not on file    Inability: Not on file  . Transportation needs    Medical: Not on file    Non-medical: Not on file  Tobacco Use  . Smoking status: Former Research scientist (life sciences)  . Smokeless tobacco: Never Used  Substance and Sexual Activity  . Alcohol use: No  . Drug use: No  . Sexual activity: Never  Lifestyle  . Physical activity    Days per week: Not on file    Minutes per session: Not on file  . Stress: Not on file  Relationships  . Social connections    Talks  on phone: Not on file    Gets together: Not on file    Attends religious service: Not on file    Active member of club or organization: Not on file    Attends meetings of clubs or organizations: Not on file    Relationship status: Not on file  . Intimate partner  violence    Fear of current or ex partner: Not on file    Emotionally abused: Not on file    Physically abused: Not on file    Forced sexual activity: Not on file  Other Topics Concern  . Not on file  Social History Narrative   Patient lives by himself in [place].  His sister, [name]/brother-in-law primary caregivers.  As per sister-patient has a learning disability.  Is currently unemployed.  Remote history of heavy alcohol use; currently none.  Remote history of smoking-currently none.  Previous marriage; no children.    FAMILY HISTORY: History reviewed. No pertinent family history.  ALLERGIES:  has No Known Allergies.  MEDICATIONS:  Current Facility-Administered Medications  Medication Dose Route Frequency Provider Last Rate Last Dose  . acetaminophen (TYLENOL) tablet 650 mg  650 mg Oral Q6H PRN Max Sane, MD   650 mg at 03/05/19 2327   Or  . acetaminophen (TYLENOL) suppository 650 mg  650 mg Rectal Q6H PRN Max Sane, MD      . albuterol (PROVENTIL) (2.5 MG/3ML) 0.083% nebulizer solution 2.5 mg  2.5 mg Nebulization Q6H PRN Rito Ehrlich A, RPH      . bisacodyl (DULCOLAX) EC tablet 5 mg  5 mg Oral Daily PRN Manuella Ghazi, Vipul, MD      . dextrose 5 % with KCl 20 mEq / L  infusion  20 mEq Intravenous Continuous Loletha Grayer, MD 75 mL/hr at 03/07/19 1214    . docusate sodium (COLACE) capsule 100 mg  100 mg Oral BID Max Sane, MD      . Derrill Memo ON 03/08/2019] enoxaparin (LOVENOX) injection 30 mg  30 mg Subcutaneous Q24H Rowland Lathe, RPH      . feeding supplement (OSMOLITE 1.5 CAL) liquid 1,000 mL  1,000 mL Per Tube Continuous Loletha Grayer, MD 20 mL/hr at 03/07/19 1506 1,000 mL at 03/07/19 1506  . fentaNYL (DURAGESIC) 12 MCG/HR 1 patch  1 patch Transdermal Q72H Loletha Grayer, MD   1 patch at 03/06/19 1603  . free water 120 mL  120 mL Per Tube Q4H Loletha Grayer, MD   120 mL at 03/07/19 1509  . HYDROcodone-acetaminophen (HYCET) 7.5-325 mg/15 ml solution 10 mL  10 mL Per Tube  Q6H PRN Loletha Grayer, MD      . midazolam (VERSED) 2 MG/2ML injection           . morphine 2 MG/ML injection 1 mg  1 mg Intravenous Q4H PRN Max Sane, MD   1 mg at 03/07/19 [id number]  . ondansetron (ZOFRAN) tablet 4 mg  4 mg Oral Q6H PRN Max Sane, MD       Or  . ondansetron (ZOFRAN) injection 4 mg  4 mg Intravenous Q6H PRN Max Sane, MD      . pantoprazole (PROTONIX) EC tablet 40 mg  40 mg Oral Daily Wieting, Richard, MD      . tiotropium [facility]) inhalation capsule ([facility] use ONLY) 18 mcg  18 mcg Inhalation Daily Manuella Ghazi, Vipul, MD      . traZODone (DESYREL) tablet 25 mg  25 mg Oral QHS PRN Max Sane, MD          .  PHYSICAL EXAMINATION:  Vitals:   03/07/19 1330 03/07/19 1405  BP: (!) 91/58 107/67  Pulse: 72 76  Resp: 17 16  Temp:  97.8 F (36.6 C)  SpO2: 95% 98%   Filed Weights   03/05/19 0500 03/06/19 0500 03/07/19 0500  Weight: 96 lb 5.5 oz (43.7 kg) 96 lb 9 oz (43.8 kg) 102 lb 1.2 oz (46.3 kg)    Physical Exam  Constitutional: He is oriented to person, place, and time.  Thin cachectic appearing Caucasian male patient.  He is alone.  HENT:  Head: Normocephalic and atraumatic.  Mouth/Throat: Oropharynx is clear and moist. No oropharyngeal exudate.  Poor dentition.  Eyes: Pupils are equal, round, and reactive to light.  Neck: Normal range of motion. Neck supple.  Cardiovascular: Normal rate and regular rhythm.  Pulmonary/Chest: No respiratory distress. He has no wheezes.  Decreased air entry bilaterally.  No wheeze or crackles  Abdominal: Soft. Bowel sounds are normal. He exhibits no distension and no mass. There is no abdominal tenderness. There is no rebound and no guarding.  Positive for PEG tube.  No signs of infection.  Musculoskeletal: Normal range of motion.        General: No tenderness or edema.  Neurological: He is alert and oriented to person, place, and time.  Skin: Skin is warm.  Psychiatric: Affect normal.     LABORATORY DATA:  I have reviewed  the data as listed Lab Results  Component Value Date   WBC 10.7 (H) 03/06/2019   HGB 11.7 (L) 03/06/2019   HCT 37.1 (L) 03/06/2019   MCV 88.8 03/06/2019   PLT 158 03/06/2019   Recent Labs    05/08/18 1459 11/05/18 0956  03/05/19 0525 03/06/19 0345 03/07/19 0345  NA 133* 136   < > 154* 146* 136  K 3.6 3.6   < > 3.4* 3.5 3.6  CL 96* 100   < > 114* 110 101  CO2 28 25   < > 26 28 27   GLUCOSE 104* 123*   < > 123* 188* 171*  BUN 21 14   < > 47* 30* 19  CREATININE 1.29* 0.97   < > 1.14 1.01 0.82  CALCIUM 9.2 9.8   < > 11.0* 10.6* 9.3  GFRNONAA 55* >60   < > >60 >60 >60  GFRAA >60 >60   < > >60 >60 >60  PROT 8.3* 8.5*  --  7.7  --   --   ALBUMIN 3.9 4.1  --  3.3*  --   --   AST 28 15  --  17  --   --   ALT 20 15  --  14  --   --   ALKPHOS 42 58  --  68  --   --   BILITOT 0.9 1.4*  --  1.0  --   --    < > = values in this interval not displayed.    RADIOGRAPHIC STUDIES: I have personally reviewed the radiological images as listed and agreed with the findings in the report. Dg Thoracic Spine 2 View  Result Date: 03/04/2019 CLINICAL DATA:  Diffuse back pain.  Generalized weakness EXAM: THORACIC SPINE 2 VIEWS COMPARISON:  None. FINDINGS: Negative for fracture or listhesis. Mild lower thoracic and midthoracic endplate spurring. No evidence of erosion or bone lesion. C5-6 and C6-7 interbody arthrodesis with multilevel laminectomy involving lower cervical and upper lumbar spine. There is exaggerated kyphosis at the cervicothoracic junction. Maintained posterior mediastinal fat planes. IMPRESSION:  1. No acute or focal finding. 2. Lower cervical ACDF and laminectomy with kyphosis at the cervicothoracic junction. Electronically Signed   By: Monte Fantasia M.D.   On: 03/04/2019 20:04   Dg Lumbar Spine 2-3 Views  Result Date: 03/04/2019 CLINICAL DATA:  Weakness.  Chronic back pain EXAM: LUMBAR SPINE - 2-3 VIEW COMPARISON:  None. FINDINGS: Negative for fracture or endplate erosion. Generally  preserved disc height with mild endplate ridging. Degenerative facet spurring at L3-4 and below. Percutaneous gastrostomy tube.  Atherosclerotic calcification IMPRESSION: No acute finding. Ordinary degenerative changes. Electronically Signed   By: Monte Fantasia M.D.   On: 03/04/2019 20:02   Ct Chest W Contrast  Result Date: 03/06/2019 CLINICAL DATA:  Unintended weight loss. Nonlocalized abdominal pain. Technologist notes state COPD, progressive dysphagia, esophageal diverticula and achalasia. G-tube placement 12/01/2018. Admitted for acute on chronic back pain malnutrition and anemia. EXAM: CT CHEST, ABDOMEN, AND PELVIS WITH CONTRAST TECHNIQUE: Multidetector CT imaging of the chest, abdomen and pelvis was performed following the standard protocol during bolus administration of intravenous contrast. CONTRAST:  15mL OMNIPAQUE IOHEXOL 300 MG/ML  SOLN COMPARISON:  Recent chest and spine radiographs, no prior cross-sectional imaging. FINDINGS: CT CHEST FINDINGS Cardiovascular: Atherosclerosis and tortuosity of the thoracic aorta. Irregular plaque in the distal descending thoracic aorta. No dissection or acute aortic syndrome. Heart is normal in size. No pericardial effusion. There are coronary artery calcifications. No filling defects in the central most pulmonary arteries to the lobar level to suggest pulmonary embolus. Mediastinum/Nodes: Dilated fluid-filled esophagus consistent with achalasia. Small focal outpouching from the distal esophagus, series 2, image 44, likely represents esophageal diverticulum. No inflammatory change or perforation. There is irregular wall thickening of the distal esophagus just proximal to the gastroesophageal junction. Borderline prevascular nodes measuring up to 10 mm. Small upper right paratracheal nodes, all subcentimeter. No enlarged hilar lymph nodes. No visualized thyroid nodule. Lungs/Pleura: Mild emphysema. Scattered areas of subpleural scarring in both lower lobes. Patchy  area of tree in bud opacities in the superior segment of the right lower lobe, series 4, image 83. No pulmonary edema or confluent airspace disease. No pleural fluid. Trachea and mainstem bronchi are patent. No pulmonary mass. Musculoskeletal: There are no acute or suspicious osseous abnormalities. Scattered areas of calcification in the dorsal spinal canal without frank canal impingement. CT ABDOMEN PELVIS FINDINGS Hepatobiliary: There are 2 enhancing lesions in the left lobe of the liver, 17 and 16 mm both series 2, image 55. Punctate hepatic granuloma in the right lobe. Gallbladder physiologically distended, no calcified stone. Common bile duct is dilated at 10 mm. No visualized choledocholithiasis. Pancreas: Mildly dilated proximal pancreatic duct at 4 mm. No obvious focal pancreatic mass. No peripancreatic inflammation. Spleen: Normal in size. Splenule anteriorly. No evidence of focal lesion, allowing for arterial phase imaging. Adrenals/Urinary Tract: Normal right adrenal gland. Left adrenal thickening without dominant nodule. Hydronephrosis. There is bilateral perinephric edema. Homogeneous enhancement with symmetric excretion on delayed phase imaging. No evidence of focal renal mass. Urinary bladder is partially distended. No obvious bladder wall thickening. Stomach/Bowel: Gastrostomy tube with balloon appropriately positioned in the stomach. Low density abutting the gastric lesser curvature is felt to represent necrotic node rather than gastric diverticulum. No bowel obstruction. Administered enteric contrast reaches the colon. No bowel wall thickening or inflammatory change. No evidence of colonic mass. Normal appendix. Vascular/Lymphatic: Abnormal bulky retroperitoneal adenopathy extending from the level of the renal vasculature to the iliac bifurcation. Some of these nodes demonstrate central low density consistent  with necrosis. Representative nodal conglomerate in the left periaortic station at the  level just below the renal veins measures 4.2 x 2.3 cm, series 2, image 74. Round soft tissue density with central low-density the upper abdomen abutting the gastric cardia/lesser curvature felt to represent a necrotic node measuring 2.0 x 1.9 cm, series 2, image 57. There are additional enlarged nodes in the porta hepatis and gastrohepatic ligament. No definite pelvic adenopathy. Advanced aortic atherosclerosis with irregular plaque in the mid aorta causing approximately 50% luminal narrowing. Limited assessment for portal vein and IVC patency given phase of contrast, however on delayed phase imaging, no obvious venous thrombus is seen. Reproductive: Prominent prostate gland spans 5.3 cm. Central prostatic calcifications. No obvious prostatic mass. Other: Bilateral retroperitoneal stranding, left greater than right. No significant ascites. No definite omental nodularity. No free air. Musculoskeletal: There are no acute or suspicious osseous abnormalities. Degenerative change in the lumbar spine. IMPRESSION: 1. Bulky retroperitoneal adenopathy, some of which appears necrotic with central low density. There also 2 enhancing lesions in the left lobe of the liver, in this setting are suspicious for metastatic disease. Primary malignancy not definitively characterize. Patient has achalasia, with irregular wall thickening of the distal esophagus, which could be a source of primary malignancy. Consider further evaluation with endoscopy. Recommend oncologic referral. 2. Borderline prevascular nodes at 10 mm, nonspecific. 3. Common bile duct dilatation at 10 mm with prominence of the pancreatic duct of 4 mm. No obvious pancreatic mass or cause of obstruction. This could be further evaluated with MRCP, if patient is able to tolerate breath hold technique. 4. Thoracoabdominal aortic atherosclerosis with irregular plaque of the distal descending thoracic and infrarenal abdominal aorta. 5. Emphysema. 6. Mild patchy tree in bud  opacities in the right lower lobe, likely infectious or inflammatory. 7. Bilateral perinephric edema which is nonspecific, and may be chronic. Aortic Atherosclerosis (ICD10-I70.0) and Emphysema (ICD10-J43.9). Electronically Signed   By: Keith Rake M.D.   On: 03/06/2019 14:58   Ct Abdomen Pelvis W Contrast  Result Date: 03/06/2019 CLINICAL DATA:  Unintended weight loss. Nonlocalized abdominal pain. Technologist notes state COPD, progressive dysphagia, esophageal diverticula and achalasia. G-tube placement 12/01/2018. Admitted for acute on chronic back pain malnutrition and anemia. EXAM: CT CHEST, ABDOMEN, AND PELVIS WITH CONTRAST TECHNIQUE: Multidetector CT imaging of the chest, abdomen and pelvis was performed following the standard protocol during bolus administration of intravenous contrast. CONTRAST:  54mL OMNIPAQUE IOHEXOL 300 MG/ML  SOLN COMPARISON:  Recent chest and spine radiographs, no prior cross-sectional imaging. FINDINGS: CT CHEST FINDINGS Cardiovascular: Atherosclerosis and tortuosity of the thoracic aorta. Irregular plaque in the distal descending thoracic aorta. No dissection or acute aortic syndrome. Heart is normal in size. No pericardial effusion. There are coronary artery calcifications. No filling defects in the central most pulmonary arteries to the lobar level to suggest pulmonary embolus. Mediastinum/Nodes: Dilated fluid-filled esophagus consistent with achalasia. Small focal outpouching from the distal esophagus, series 2, image 44, likely represents esophageal diverticulum. No inflammatory change or perforation. There is irregular wall thickening of the distal esophagus just proximal to the gastroesophageal junction. Borderline prevascular nodes measuring up to 10 mm. Small upper right paratracheal nodes, all subcentimeter. No enlarged hilar lymph nodes. No visualized thyroid nodule. Lungs/Pleura: Mild emphysema. Scattered areas of subpleural scarring in both lower lobes. Patchy  area of tree in bud opacities in the superior segment of the right lower lobe, series 4, image 83. No pulmonary edema or confluent airspace disease. No pleural fluid. Trachea and  mainstem bronchi are patent. No pulmonary mass. Musculoskeletal: There are no acute or suspicious osseous abnormalities. Scattered areas of calcification in the dorsal spinal canal without frank canal impingement. CT ABDOMEN PELVIS FINDINGS Hepatobiliary: There are 2 enhancing lesions in the left lobe of the liver, 17 and 16 mm both series 2, image 55. Punctate hepatic granuloma in the right lobe. Gallbladder physiologically distended, no calcified stone. Common bile duct is dilated at 10 mm. No visualized choledocholithiasis. Pancreas: Mildly dilated proximal pancreatic duct at 4 mm. No obvious focal pancreatic mass. No peripancreatic inflammation. Spleen: Normal in size. Splenule anteriorly. No evidence of focal lesion, allowing for arterial phase imaging. Adrenals/Urinary Tract: Normal right adrenal gland. Left adrenal thickening without dominant nodule. Hydronephrosis. There is bilateral perinephric edema. Homogeneous enhancement with symmetric excretion on delayed phase imaging. No evidence of focal renal mass. Urinary bladder is partially distended. No obvious bladder wall thickening. Stomach/Bowel: Gastrostomy tube with balloon appropriately positioned in the stomach. Low density abutting the gastric lesser curvature is felt to represent necrotic node rather than gastric diverticulum. No bowel obstruction. Administered enteric contrast reaches the colon. No bowel wall thickening or inflammatory change. No evidence of colonic mass. Normal appendix. Vascular/Lymphatic: Abnormal bulky retroperitoneal adenopathy extending from the level of the renal vasculature to the iliac bifurcation. Some of these nodes demonstrate central low density consistent with necrosis. Representative nodal conglomerate in the left periaortic station at the  level just below the renal veins measures 4.2 x 2.3 cm, series 2, image 74. Round soft tissue density with central low-density the upper abdomen abutting the gastric cardia/lesser curvature felt to represent a necrotic node measuring 2.0 x 1.9 cm, series 2, image 57. There are additional enlarged nodes in the porta hepatis and gastrohepatic ligament. No definite pelvic adenopathy. Advanced aortic atherosclerosis with irregular plaque in the mid aorta causing approximately 50% luminal narrowing. Limited assessment for portal vein and IVC patency given phase of contrast, however on delayed phase imaging, no obvious venous thrombus is seen. Reproductive: Prominent prostate gland spans 5.3 cm. Central prostatic calcifications. No obvious prostatic mass. Other: Bilateral retroperitoneal stranding, left greater than right. No significant ascites. No definite omental nodularity. No free air. Musculoskeletal: There are no acute or suspicious osseous abnormalities. Degenerative change in the lumbar spine. IMPRESSION: 1. Bulky retroperitoneal adenopathy, some of which appears necrotic with central low density. There also 2 enhancing lesions in the left lobe of the liver, in this setting are suspicious for metastatic disease. Primary malignancy not definitively characterize. Patient has achalasia, with irregular wall thickening of the distal esophagus, which could be a source of primary malignancy. Consider further evaluation with endoscopy. Recommend oncologic referral. 2. Borderline prevascular nodes at 10 mm, nonspecific. 3. Common bile duct dilatation at 10 mm with prominence of the pancreatic duct of 4 mm. No obvious pancreatic mass or cause of obstruction. This could be further evaluated with MRCP, if patient is able to tolerate breath hold technique. 4. Thoracoabdominal aortic atherosclerosis with irregular plaque of the distal descending thoracic and infrarenal abdominal aorta. 5. Emphysema. 6. Mild patchy tree in bud  opacities in the right lower lobe, likely infectious or inflammatory. 7. Bilateral perinephric edema which is nonspecific, and may be chronic. Aortic Atherosclerosis (ICD10-I70.0) and Emphysema (ICD10-J43.9). Electronically Signed   By: Keith Rake M.D.   On: 03/06/2019 14:58   US Biopsy (liver)  Result Date: 03/07/2019 INDICATION: No known primary, now with thickening of the distal esophagus and associated adenopathy and ill-defined lesions within the left  lobe of the liver worrisome for metastatic disease. As such, request made for ultrasound-guided liver lesion biopsy for tissue diagnostic purposes. EXAM: ULTRASOUND GUIDED LIVER LESION BIOPSY COMPARISON:  CT of the chest, abdomen and pelvis-03/06/2019 MEDICATIONS: None ANESTHESIA/SEDATION: Fentanyl 50 mcg IV; Versed 1 mg IV Total Moderate Sedation time:  10 Minutes. The patient's level of consciousness and vital signs were monitored continuously by radiology nursing throughout the procedure under my direct supervision. COMPLICATIONS: None immediate. PROCEDURE: Informed written consent was obtained from the patient after a discussion of the risks, benefits and alternatives to treatment. The patient understands and consents the procedure. A timeout was performed prior to the initiation of the procedure. Ultrasound scanning was performed of the right upper abdominal quadrant demonstrates an approximately 1.5 x 1.5 lesion within the subcapsular aspect the left lobe of the liver correlating with the lesion seen on preceding abdominal CT image 55, series 2). The procedure was planned. The midline of the abdomen was prepped and draped in the usual sterile fashion. The overlying soft tissues were anesthetized with 1% lidocaine with epinephrine. A 17 gauge, 6.8 cm co-axial needle was advanced into a peripheral aspect of the lesion. This was followed by 4 core biopsies with an 18 gauge core device under direct ultrasound guidance. Multiple ultrasound images were  saved for procedural documentation purposes. The coaxial needle tract was embolized with a small amount of Gel-Foam slurry and superficial hemostasis was obtained with manual compression. Post procedural scanning was negative for definitive area of hemorrhage or additional complication. A dressing was placed. The patient tolerated the procedure well without immediate post procedural complication. IMPRESSION: Technically successful ultrasound guided core needle biopsy of indeterminate lesion within the subcapsular aspect of the left lobe of the liver. Electronically Signed   By: Sandi Mariscal M.D.   On: 03/07/2019 12:51   Dg Chest Portable 1 View  Result Date: 03/04/2019 CLINICAL DATA:  Weakness EXAM: PORTABLE CHEST 1 VIEW COMPARISON:  May 08, 2018 FINDINGS: Lungs are hyperexpanded. No edema or consolidation. Heart size and pulmonary vascularity are normal. No adenopathy. Gastrostomy catheter positioned in left upper quadrant. IMPRESSION: Lungs hyperexpanded. No edema or consolidation. Cardiac silhouette within normal limits. No adenopathy evident. Electronically Signed   By: Lowella Grip III M.D.   On: 03/04/2019 15:34    Liver lesion, left lobe #73 year old male patient with recent diagnosis of achalasia/weight loss-s/p PEG tube; is currently admitted to hospital for dehydration/acute renal failure; noted to have liver lesion bulky retroperitoneal adenopathy  #Liver lesions/bulky retroperitoneal adenopathy- It is highly concerning for malignancy-which seems to be attributing to his overall pain/continue weight loss.  I agree with biopsy as planned today.  Recent EGD at [facility]/as per the records-no obvious source of malignancy noted.  Await biopsy results-for further work-up./See below.  Will order tumor markers.  #Weight loss/poor p.o. intake-s/p PEG tube-achalasia/underlying possible malignancy-unfortunately portends poor prognosis/poor tolerance to therapy down the line.  See  below  #Electrolyte/acute insufficiency-status post hydration-improved.  #Prognosis: Unfortunately appears poor-given patient's significant weight loss/dehydration/borderline performance status-especially given the concerns for malignancy noted on imaging.  I discussed my concerns with the patient's sister at length.  For now would await results of biopsy-to make any conclusive decisions regarding treatment options.  Patient is DNR/DNI/which I think is reasonable.   Thank you Dr.Weiting for allowing me to participate in the care of your pleasant patient. Please do not hesitate to contact me with questions or concerns in the interim.  Spoke to patient's sister at length regarding  the above plan of care.  We will follow closely.  All questions were answered.     Cammie Sickle, MD 03/07/2019 4:24 PM

## 2019-03-07 NOTE — OR Nursing (Signed)
Pt asleep. BP 81/53. Heart rate sinus rhythm Dr Pascal Lux aware. No new orders

## 2019-03-07 NOTE — Procedures (Signed)
Pre Procedure Dx: Liver lesion Post Procedural Dx: Same  Technically successful US guided biopsy of indeterminate lesion within the left lobe of the liver.  EBL: None  No immediate complications.   Jay Jaideep Pollack, MD Pager #: [phone]    

## 2019-03-07 NOTE — Progress Notes (Signed)
PROGRESS NOTE    Oscar Sanchez  [id number] DOB: 12-11-1945 DOA: 03/04/2019  PCP: Wardell Honour, MD    LOS - 2   Brief Narrative:  73 y.o. male with a known history of esophageal diverticulum and achalasia status post PEG tube, admitted 03/04/19 for acute on chronic back pain, unable to eat and nausea.  Patient had been unable to tolerate PEG tube feeds for several days prior to admission, due to intractable nausea.  Palliative care consulted.  Patient not surgical candidate to address esophageal issues unless able to gain 30 pounds.    Subjective 12/2: Patient awake, laying on his side, in fetal position.  Reports ongoing 10/10 pain.  He is not sure the Fentanyl patch is helping yet.  Denies fever/chills.  Reports diffuse pain all over his body.  No acute events reported overnight.   Assessment & Plan:   Active Problems:   Acute kidney injury (HCC)   Protein-calorie malnutrition, severe   Severe malnutrition (HCC)   Hypernatremia   Hypercalcemia   Achalasia   Hypokalemia   Malignant neoplasm metastatic to liver [facility])   Retroperitoneal mass   Likely metastatic malignancy with liver lesions and retroperitoneal adenopathy and thickened esophagus - to have biopsy with IR today - palliative care consulted - unlikely candidate for systemic chemo given poor functional and nutritional status  Acute kidney injury secondary to dehydration - resolved with fluids Likely from poor p.o. intake - monitor renal function  Hypernatremia - resolved with fluids Likely due to dehydration from poor p.o. intake.  Continue D5W for now as still not taking in much.  Sinus tachycardia -Likely from severe back pain -We will order pain medicine and get x-ray of his back, monitor his heart rate  Acute on chronic back pain  Get thoracolumbar x-rays, consider MRI if pain continues or clinical worsening  Protein-calorie malnutrition, severe  - dietician consulted - high risk for  refeeding syndrome -previously, at home he was to take Nutren 1.5 to six cans per day    Esophageal diverticulum/achalasia -followed at [facility] - goal of 30 pound weight gain to be a surgical candidate.  Iron deficiency anemia due to chronic blood loss -monitor blood counts  Chronic Constipation - lactulose as needed  Chronic Pain - suspect due to malignancy, primary to be determined.  Started low-dose fentanyl patch.  PRN liquid morphine via PEG tube.  Generalized Weakness - secondary to malnutrition and poor PO intake  DVT prophylaxis: Lovenox   Code Status: DNR  Family Communication: none at bedsdie  Disposition Plan:  Pending further improvement, pain control.  Expect he may need placement.   Consultants:   Palliative care  Interventional Radiology  Procedures:   CT-guided liver biopsy 12/2  Antimicrobials:   None    Objective: Vitals:   03/07/19 1210 03/07/19 1215 03/07/19 1221 03/07/19 1231  BP: 99/61 95/63 (!) 82/54 (!) 89/54  Pulse: 78 81 79 76  Resp: (!) 21 (!) 21 15 15   Temp:      TempSrc:      SpO2: 97% 96% 95% 93%  Weight:      Height:        Intake/Output Summary (Last 24 hours) at 03/07/2019 1237 Last data filed at 03/07/2019 1214 Gross per 24 hour  Intake 3262.65 ml  Output 475 ml  Net 2787.65 ml   Filed Weights   03/05/19 0500 03/06/19 0500 03/07/19 0500  Weight: 43.7 kg 43.8 kg 46.3 kg    Examination:  General  exam: awake, alert, no acute distress, frail, cachectic, chronically ill appearing HEENT: dry mucus membranes, hearing grossly normal  Respiratory system: clear to auscultation bilaterally, no wheezes, rales or rhonchi, normal respiratory effort. Cardiovascular system: normal S1/S2, RRR, no JVD, murmurs, rubs, gallops, no pedal edema.   Gastrointestinal system: soft, non-tender, non-distended abdomen, PEG in place Central nervous system: alert and oriented x4. no gross focal neurologic deficits, normal speech Extremities: moves  all, no edema, normal tone Psychiatry: normal mood, congruent affect   Data Reviewed: I have personally reviewed following labs and imaging studies  CBC: Recent Labs  Lab 03/04/19 1412 03/05/19 0525 03/06/19 0345  WBC 19.3* 13.7* 10.7*  HGB 16.4 13.0 11.7*  HCT 50.2 40.5 37.1*  MCV 85.2 86.5 88.8  PLT 276 195 0000000   Basic Metabolic Panel: Recent Labs  Lab 03/04/19 1412 03/05/19 0525 03/06/19 0345 03/07/19 0345  NA 152* 154* 146* 136  K 3.6 3.4* 3.5 3.6  CL 105 114* 110 101  CO2 24 26 28 27   GLUCOSE 158* 123* 188* 171*  BUN 56* 47* 30* 19  CREATININE 1.54* 1.14 1.01 0.82  CALCIUM 12.6* 11.0* 10.6* 9.3  MG  --   --  2.3  --   PHOS  --   --  2.0*  --    GFR: Estimated Creatinine Clearance: 52.5 mL/min (by C-G formula based on SCr of 0.82 mg/dL). Liver Function Tests: Recent Labs  Lab 03/05/19 0525  AST 17  ALT 14  ALKPHOS 68  BILITOT 1.0  PROT 7.7  ALBUMIN 3.3*   No results for input(s): LIPASE, AMYLASE in the last 168 hours. No results for input(s): AMMONIA in the last 168 hours. Coagulation Profile: Recent Labs  Lab 03/07/19 0345  INR 1.2   Cardiac Enzymes: No results for input(s): CKTOTAL, CKMB, CKMBINDEX, TROPONINI in the last 168 hours. BNP (last 3 results) No results for input(s): PROBNP in the last 8760 hours. HbA1C: Recent Labs    03/04/19 1412  HGBA1C 6.3*   CBG: Recent Labs  Lab 03/06/19 1146 03/06/19 1642 03/06/19 2138 03/07/19 0624 03/07/19 0828  GLUCAP 92 175* 150* 141* 129*   Lipid Profile: No results for input(s): CHOL, HDL, LDLCALC, TRIG, CHOLHDL, LDLDIRECT in the last 72 hours. Thyroid Function Tests: Recent Labs    03/06/19 0345  TSH 0.246*   Anemia Panel: Recent Labs    03/06/19 0345  VITAMINB12 1,161*   Sepsis Labs: Recent Labs  Lab 03/04/19 1412 03/04/19 1515 03/04/19 1715 03/05/19 0525 03/06/19 0345  PROCALCITON <0.10  --   --  <0.10 <0.10  LATICACIDVEN  --  2.6* 2.6*  --   --     Recent Results  (from the past 240 hour(s))  Culture, blood (routine x 2)     Status: None (Preliminary result)   Collection Time: 03/04/19  3:15 PM   Specimen: BLOOD  Result Value Ref Range Status   Specimen Description BLOOD RIGHT ANTECUBITAL  Final   Special Requests   Final    BOTTLES DRAWN AEROBIC AND ANAEROBIC Blood Culture results may not be optimal due to an inadequate volume of blood received in culture bottles   Culture   Final    NO GROWTH 2 DAYS Performed at [facility], [address] [place], [place] [address]    Report Status PENDING  Incomplete  Culture, blood (routine x 2)     Status: None (Preliminary result)   Collection Time: 03/04/19  5:42 PM   Specimen: BLOOD  Result Value Ref Range Status   Specimen Description BLOOD FOREARM  Final   Special Requests   Final    BOTTLES DRAWN AEROBIC AND ANAEROBIC Blood Culture adequate volume   Culture   Final    NO GROWTH 2 DAYS Performed at [facility], [address] [place], [place] [address]    Report Status PENDING  Incomplete  SARS CORONAVIRUS 2 (TAT 6-24 HRS) Nasopharyngeal Nasopharyngeal Swab     Status: None   Collection Time: 03/04/19  6:25 PM   Specimen: Nasopharyngeal Swab  Result Value Ref Range Status   SARS Coronavirus 2 NEGATIVE NEGATIVE Final    Comment: (NOTE) SARS-CoV-2 target nucleic acids are NOT DETECTED. The SARS-CoV-2 RNA is generally detectable in upper and lower respiratory specimens during the acute phase of infection. Negative results do not preclude SARS-CoV-2 infection, do not rule out co-infections with other pathogens, and should not be used as the sole basis for treatment or other patient management decisions. Negative results must be combined with clinical observations, patient history, and epidemiological information. The expected result is Negative. Fact Sheet for Patients: SugarRoll.be Fact Sheet for Healthcare  Providers: https://www.woods-mathews.com/ This test is not yet approved or cleared by the [place] FDA and  has been authorized for detection and/or diagnosis of SARS-CoV-2 by FDA under an Emergency Use Authorization (EUA). This EUA will remain  in effect (meaning this test can be used) for the duration of the COVID-19 declaration under Section 56 4(b)(1) of the Act, 21 U.S.C. section 360bbb-3(b)(1), unless the authorization is terminated or revoked sooner. Performed at [facility], [place] [address], [place], [place] [address]          Radiology Studies: Ct Chest W Contrast  Result Date: 03/06/2019 CLINICAL DATA:  Unintended weight loss. Nonlocalized abdominal pain. Technologist notes state COPD, progressive dysphagia, esophageal diverticula and achalasia. G-tube placement 12/01/2018. Admitted for acute on chronic back pain malnutrition and anemia. EXAM: CT CHEST, ABDOMEN, AND PELVIS WITH CONTRAST TECHNIQUE: Multidetector CT imaging of the chest, abdomen and pelvis was performed following the standard protocol during bolus administration of intravenous contrast. CONTRAST:  68mL OMNIPAQUE IOHEXOL 300 MG/ML  SOLN COMPARISON:  Recent chest and spine radiographs, no prior cross-sectional imaging. FINDINGS: CT CHEST FINDINGS Cardiovascular: Atherosclerosis and tortuosity of the thoracic aorta. Irregular plaque in the distal descending thoracic aorta. No dissection or acute aortic syndrome. Heart is normal in size. No pericardial effusion. There are coronary artery calcifications. No filling defects in the central most pulmonary arteries to the lobar level to suggest pulmonary embolus. Mediastinum/Nodes: Dilated fluid-filled esophagus consistent with achalasia. Small focal outpouching from the distal esophagus, series 2, image 44, likely represents esophageal diverticulum. No inflammatory change or perforation. There is irregular wall thickening of the distal esophagus just  proximal to the gastroesophageal junction. Borderline prevascular nodes measuring up to 10 mm. Small upper right paratracheal nodes, all subcentimeter. No enlarged hilar lymph nodes. No visualized thyroid nodule. Lungs/Pleura: Mild emphysema. Scattered areas of subpleural scarring in both lower lobes. Patchy area of tree in bud opacities in the superior segment of the right lower lobe, series 4, image 83. No pulmonary edema or confluent airspace disease. No pleural fluid. Trachea and mainstem bronchi are patent. No pulmonary mass. Musculoskeletal: There are no acute or suspicious osseous abnormalities. Scattered areas of calcification in the dorsal spinal canal without frank canal impingement. CT ABDOMEN PELVIS FINDINGS Hepatobiliary: There are 2 enhancing lesions in the left lobe of the liver, 17 and 16  mm both series 2, image 55. Punctate hepatic granuloma in the right lobe. Gallbladder physiologically distended, no calcified stone. Common bile duct is dilated at 10 mm. No visualized choledocholithiasis. Pancreas: Mildly dilated proximal pancreatic duct at 4 mm. No obvious focal pancreatic mass. No peripancreatic inflammation. Spleen: Normal in size. Splenule anteriorly. No evidence of focal lesion, allowing for arterial phase imaging. Adrenals/Urinary Tract: Normal right adrenal gland. Left adrenal thickening without dominant nodule. Hydronephrosis. There is bilateral perinephric edema. Homogeneous enhancement with symmetric excretion on delayed phase imaging. No evidence of focal renal mass. Urinary bladder is partially distended. No obvious bladder wall thickening. Stomach/Bowel: Gastrostomy tube with balloon appropriately positioned in the stomach. Low density abutting the gastric lesser curvature is felt to represent necrotic node rather than gastric diverticulum. No bowel obstruction. Administered enteric contrast reaches the colon. No bowel wall thickening or inflammatory change. No evidence of colonic  mass. Normal appendix. Vascular/Lymphatic: Abnormal bulky retroperitoneal adenopathy extending from the level of the renal vasculature to the iliac bifurcation. Some of these nodes demonstrate central low density consistent with necrosis. Representative nodal conglomerate in the left periaortic station at the level just below the renal veins measures 4.2 x 2.3 cm, series 2, image 74. Round soft tissue density with central low-density the upper abdomen abutting the gastric cardia/lesser curvature felt to represent a necrotic node measuring 2.0 x 1.9 cm, series 2, image 57. There are additional enlarged nodes in the porta hepatis and gastrohepatic ligament. No definite pelvic adenopathy. Advanced aortic atherosclerosis with irregular plaque in the mid aorta causing approximately 50% luminal narrowing. Limited assessment for portal vein and IVC patency given phase of contrast, however on delayed phase imaging, no obvious venous thrombus is seen. Reproductive: Prominent prostate gland spans 5.3 cm. Central prostatic calcifications. No obvious prostatic mass. Other: Bilateral retroperitoneal stranding, left greater than right. No significant ascites. No definite omental nodularity. No free air. Musculoskeletal: There are no acute or suspicious osseous abnormalities. Degenerative change in the lumbar spine. IMPRESSION: 1. Bulky retroperitoneal adenopathy, some of which appears necrotic with central low density. There also 2 enhancing lesions in the left lobe of the liver, in this setting are suspicious for metastatic disease. Primary malignancy not definitively characterize. Patient has achalasia, with irregular wall thickening of the distal esophagus, which could be a source of primary malignancy. Consider further evaluation with endoscopy. Recommend oncologic referral. 2. Borderline prevascular nodes at 10 mm, nonspecific. 3. Common bile duct dilatation at 10 mm with prominence of the pancreatic duct of 4 mm. No obvious  pancreatic mass or cause of obstruction. This could be further evaluated with MRCP, if patient is able to tolerate breath hold technique. 4. Thoracoabdominal aortic atherosclerosis with irregular plaque of the distal descending thoracic and infrarenal abdominal aorta. 5. Emphysema. 6. Mild patchy tree in bud opacities in the right lower lobe, likely infectious or inflammatory. 7. Bilateral perinephric edema which is nonspecific, and may be chronic. Aortic Atherosclerosis (ICD10-I70.0) and Emphysema (ICD10-J43.9). Electronically Signed   By: Keith Rake M.D.   On: 03/06/2019 14:58   Ct Abdomen Pelvis W Contrast  Result Date: 03/06/2019 CLINICAL DATA:  Unintended weight loss. Nonlocalized abdominal pain. Technologist notes state COPD, progressive dysphagia, esophageal diverticula and achalasia. G-tube placement 12/01/2018. Admitted for acute on chronic back pain malnutrition and anemia. EXAM: CT CHEST, ABDOMEN, AND PELVIS WITH CONTRAST TECHNIQUE: Multidetector CT imaging of the chest, abdomen and pelvis was performed following the standard protocol during bolus administration of intravenous contrast. CONTRAST:  62mL OMNIPAQUE IOHEXOL  300 MG/ML  SOLN COMPARISON:  Recent chest and spine radiographs, no prior cross-sectional imaging. FINDINGS: CT CHEST FINDINGS Cardiovascular: Atherosclerosis and tortuosity of the thoracic aorta. Irregular plaque in the distal descending thoracic aorta. No dissection or acute aortic syndrome. Heart is normal in size. No pericardial effusion. There are coronary artery calcifications. No filling defects in the central most pulmonary arteries to the lobar level to suggest pulmonary embolus. Mediastinum/Nodes: Dilated fluid-filled esophagus consistent with achalasia. Small focal outpouching from the distal esophagus, series 2, image 44, likely represents esophageal diverticulum. No inflammatory change or perforation. There is irregular wall thickening of the distal esophagus just  proximal to the gastroesophageal junction. Borderline prevascular nodes measuring up to 10 mm. Small upper right paratracheal nodes, all subcentimeter. No enlarged hilar lymph nodes. No visualized thyroid nodule. Lungs/Pleura: Mild emphysema. Scattered areas of subpleural scarring in both lower lobes. Patchy area of tree in bud opacities in the superior segment of the right lower lobe, series 4, image 83. No pulmonary edema or confluent airspace disease. No pleural fluid. Trachea and mainstem bronchi are patent. No pulmonary mass. Musculoskeletal: There are no acute or suspicious osseous abnormalities. Scattered areas of calcification in the dorsal spinal canal without frank canal impingement. CT ABDOMEN PELVIS FINDINGS Hepatobiliary: There are 2 enhancing lesions in the left lobe of the liver, 17 and 16 mm both series 2, image 55. Punctate hepatic granuloma in the right lobe. Gallbladder physiologically distended, no calcified stone. Common bile duct is dilated at 10 mm. No visualized choledocholithiasis. Pancreas: Mildly dilated proximal pancreatic duct at 4 mm. No obvious focal pancreatic mass. No peripancreatic inflammation. Spleen: Normal in size. Splenule anteriorly. No evidence of focal lesion, allowing for arterial phase imaging. Adrenals/Urinary Tract: Normal right adrenal gland. Left adrenal thickening without dominant nodule. Hydronephrosis. There is bilateral perinephric edema. Homogeneous enhancement with symmetric excretion on delayed phase imaging. No evidence of focal renal mass. Urinary bladder is partially distended. No obvious bladder wall thickening. Stomach/Bowel: Gastrostomy tube with balloon appropriately positioned in the stomach. Low density abutting the gastric lesser curvature is felt to represent necrotic node rather than gastric diverticulum. No bowel obstruction. Administered enteric contrast reaches the colon. No bowel wall thickening or inflammatory change. No evidence of colonic  mass. Normal appendix. Vascular/Lymphatic: Abnormal bulky retroperitoneal adenopathy extending from the level of the renal vasculature to the iliac bifurcation. Some of these nodes demonstrate central low density consistent with necrosis. Representative nodal conglomerate in the left periaortic station at the level just below the renal veins measures 4.2 x 2.3 cm, series 2, image 74. Round soft tissue density with central low-density the upper abdomen abutting the gastric cardia/lesser curvature felt to represent a necrotic node measuring 2.0 x 1.9 cm, series 2, image 57. There are additional enlarged nodes in the porta hepatis and gastrohepatic ligament. No definite pelvic adenopathy. Advanced aortic atherosclerosis with irregular plaque in the mid aorta causing approximately 50% luminal narrowing. Limited assessment for portal vein and IVC patency given phase of contrast, however on delayed phase imaging, no obvious venous thrombus is seen. Reproductive: Prominent prostate gland spans 5.3 cm. Central prostatic calcifications. No obvious prostatic mass. Other: Bilateral retroperitoneal stranding, left greater than right. No significant ascites. No definite omental nodularity. No free air. Musculoskeletal: There are no acute or suspicious osseous abnormalities. Degenerative change in the lumbar spine. IMPRESSION: 1. Bulky retroperitoneal adenopathy, some of which appears necrotic with central low density. There also 2 enhancing lesions in the left lobe of the liver, in this  setting are suspicious for metastatic disease. Primary malignancy not definitively characterize. Patient has achalasia, with irregular wall thickening of the distal esophagus, which could be a source of primary malignancy. Consider further evaluation with endoscopy. Recommend oncologic referral. 2. Borderline prevascular nodes at 10 mm, nonspecific. 3. Common bile duct dilatation at 10 mm with prominence of the pancreatic duct of 4 mm. No obvious  pancreatic mass or cause of obstruction. This could be further evaluated with MRCP, if patient is able to tolerate breath hold technique. 4. Thoracoabdominal aortic atherosclerosis with irregular plaque of the distal descending thoracic and infrarenal abdominal aorta. 5. Emphysema. 6. Mild patchy tree in bud opacities in the right lower lobe, likely infectious or inflammatory. 7. Bilateral perinephric edema which is nonspecific, and may be chronic. Aortic Atherosclerosis (ICD10-I70.0) and Emphysema (ICD10-J43.9). Electronically Signed   By: Keith Rake M.D.   On: 03/06/2019 14:58        Scheduled Meds:  docusate sodium  100 mg Oral BID   fentaNYL  1 patch Transdermal Q72H   free water  120 mL Per Tube Q4H   midazolam       pantoprazole  40 mg Oral Daily   tiotropium  18 mcg Inhalation Daily   Continuous Infusions:  dextrose 5 % with KCl 20 mEq / L 75 mL/hr at 03/07/19 1214   feeding supplement (OSMOLITE 1.5 CAL) Stopped (03/07/19 0500)     LOS: 2 days    Time spent: 30-35 minutes    Ezekiel Slocumb, DO [facility] Pager: [phone]  If 7PM-7AM, please contact night-coverage [email or web address] Password [facility] 03/07/2019, 12:37 PM

## 2019-03-07 NOTE — Progress Notes (Signed)
Palliative Note:  Patient currently off unit for procedure. Will re-attempt to have [place] discussion at a later date/time.   Alda Lea, AGPCNP-BC Palliative Medicine Team   NO CHARGE

## 2019-03-07 NOTE — Consult Note (Signed)
Chief Complaint: Liver lesions worrisome for metastatic disease.    Referring Physician(s): Sanders  Patient Status: [name] - In-pt  History of Present Illness: Oscar Sanchez is a 73 y.o. male with history of COPD and severe protein calorie malnutrition, post gastrostomy tube placement who was found to have extensive thickening of the distal esophagus as well as porta hepatis and retroperitoneal lymphadenopathy worrisome for metastatic disease.  Additionally, patient was found to have ill-defined liver lesions also worrisome for metastatic disease and as such request made for ultrasound-guided liver lesion biopsy for tissue diagnostic purposes.  Patient is currently admitted to the hospital.  Past Medical History:  Diagnosis Date   Esophageal diverticulum     History reviewed. No pertinent surgical history.  Allergies: Patient has no known allergies.  Medications: Prior to Admission medications   Medication Sig Start Date End Date Taking? Authorizing Provider  gabapentin (NEURONTIN) 100 MG capsule Take 100 mg by mouth 3 (three) times daily. 09/29/18  Yes [provider]  Nutritional Supplements (NUTREN 1.5) LIQD 250 mLs by Feeding Tube route 5 (five) times daily. 12/08/18  Yes [provider]  pantoprazole (PROTONIX) 40 MG tablet Take 40 mg by mouth daily. 09/29/18  Yes [provider]  Polyethylene Glycol 3350 (PEG 3350) 17 GM/SCOOP POWD Take 17 g by mouth daily.   Yes [provider]  tiotropium (SPIRIVA) 18 MCG inhalation capsule Place 18 mcg into inhaler and inhale daily.   Yes [provider]  albuterol (PROVENTIL HFA;VENTOLIN HFA) 108 (90 Base) MCG/ACT inhaler Inhale 2 puffs into the lungs every 6 (six) hours as needed for wheezing or shortness of breath. 05/08/18   Laban Emperor, PA-C  meloxicam (MOBIC) 15 MG tablet Take 15 mg by mouth daily.    [provider]     History reviewed. No pertinent family  history.  Social History   Socioeconomic History   Marital status: Divorced    Spouse name: Not on file   Number of children: Not on file   Years of education: Not on file   Highest education level: Not on file  Occupational History   Not on file  Social Needs   Financial resource strain: Not on file   Food insecurity    Worry: Not on file    Inability: Not on file   Transportation needs    Medical: Not on file    Non-medical: Not on file  Tobacco Use   Smoking status: Former Smoker   Smokeless tobacco: Never Used  Substance and Sexual Activity   Alcohol use: No   Drug use: No   Sexual activity: Never  Lifestyle   Physical activity    Days per week: Not on file    Minutes per session: Not on file   Stress: Not on file  Relationships   Social connections    Talks on phone: Not on file    Gets together: Not on file    Attends religious service: Not on file    Active member of club or organization: Not on file    Attends meetings of clubs or organizations: Not on file    Relationship status: Not on file  Other Topics Concern   Not on file  Social History Narrative   Not on file    ECOG Status: 2 - Symptomatic, <50% confined to bed  Review of Systems: A 12 point ROS discussed and pertinent positives are indicated in the HPI above.  All other systems are negative.  Review of Systems  Vital Signs: BP (!) 89/54    Pulse 76    Temp 98.9 F (37.2 C) (Oral)    Resp 15    Ht 5\' 9"  (1.753 m)    Wt 46.3 kg    SpO2 93%    BMI 15.07 kg/m   Physical Exam  Imaging: Dg Thoracic Spine 2 View  Result Date: 03/04/2019 CLINICAL DATA:  Diffuse back pain.  Generalized weakness EXAM: THORACIC SPINE 2 VIEWS COMPARISON:  None. FINDINGS: Negative for fracture or listhesis. Mild lower thoracic and midthoracic endplate spurring. No evidence of erosion or bone lesion. C5-6 and C6-7 interbody arthrodesis with multilevel laminectomy involving lower cervical and upper  lumbar spine. There is exaggerated kyphosis at the cervicothoracic junction. Maintained posterior mediastinal fat planes. IMPRESSION: 1. No acute or focal finding. 2. Lower cervical ACDF and laminectomy with kyphosis at the cervicothoracic junction. Electronically Signed   By: Monte Fantasia M.D.   On: 03/04/2019 20:04   Dg Lumbar Spine 2-3 Views  Result Date: 03/04/2019 CLINICAL DATA:  Weakness.  Chronic back pain EXAM: LUMBAR SPINE - 2-3 VIEW COMPARISON:  None. FINDINGS: Negative for fracture or endplate erosion. Generally preserved disc height with mild endplate ridging. Degenerative facet spurring at L3-4 and below. Percutaneous gastrostomy tube.  Atherosclerotic calcification IMPRESSION: No acute finding. Ordinary degenerative changes. Electronically Signed   By: Monte Fantasia M.D.   On: 03/04/2019 20:02   Ct Chest W Contrast  Result Date: 03/06/2019 CLINICAL DATA:  Unintended weight loss. Nonlocalized abdominal pain. Technologist notes state COPD, progressive dysphagia, esophageal diverticula and achalasia. G-tube placement 12/01/2018. Admitted for acute on chronic back pain malnutrition and anemia. EXAM: CT CHEST, ABDOMEN, AND PELVIS WITH CONTRAST TECHNIQUE: Multidetector CT imaging of the chest, abdomen and pelvis was performed following the standard protocol during bolus administration of intravenous contrast. CONTRAST:  64mL OMNIPAQUE IOHEXOL 300 MG/ML  SOLN COMPARISON:  Recent chest and spine radiographs, no prior cross-sectional imaging. FINDINGS: CT CHEST FINDINGS Cardiovascular: Atherosclerosis and tortuosity of the thoracic aorta. Irregular plaque in the distal descending thoracic aorta. No dissection or acute aortic syndrome. Heart is normal in size. No pericardial effusion. There are coronary artery calcifications. No filling defects in the central most pulmonary arteries to the lobar level to suggest pulmonary embolus. Mediastinum/Nodes: Dilated fluid-filled esophagus consistent with  achalasia. Small focal outpouching from the distal esophagus, series 2, image 44, likely represents esophageal diverticulum. No inflammatory change or perforation. There is irregular wall thickening of the distal esophagus just proximal to the gastroesophageal junction. Borderline prevascular nodes measuring up to 10 mm. Small upper right paratracheal nodes, all subcentimeter. No enlarged hilar lymph nodes. No visualized thyroid nodule. Lungs/Pleura: Mild emphysema. Scattered areas of subpleural scarring in both lower lobes. Patchy area of tree in bud opacities in the superior segment of the right lower lobe, series 4, image 83. No pulmonary edema or confluent airspace disease. No pleural fluid. Trachea and mainstem bronchi are patent. No pulmonary mass. Musculoskeletal: There are no acute or suspicious osseous abnormalities. Scattered areas of calcification in the dorsal spinal canal without frank canal impingement. CT ABDOMEN PELVIS FINDINGS Hepatobiliary: There are 2 enhancing lesions in the left lobe of the liver, 17 and 16 mm both series 2, image 55. Punctate hepatic granuloma in the right lobe. Gallbladder physiologically distended, no calcified stone. Common bile duct is dilated at 10 mm. No visualized choledocholithiasis. Pancreas: Mildly dilated proximal pancreatic duct at 4 mm. No obvious focal pancreatic mass. No peripancreatic inflammation. Spleen:  Normal in size. Splenule anteriorly. No evidence of focal lesion, allowing for arterial phase imaging. Adrenals/Urinary Tract: Normal right adrenal gland. Left adrenal thickening without dominant nodule. Hydronephrosis. There is bilateral perinephric edema. Homogeneous enhancement with symmetric excretion on delayed phase imaging. No evidence of focal renal mass. Urinary bladder is partially distended. No obvious bladder wall thickening. Stomach/Bowel: Gastrostomy tube with balloon appropriately positioned in the stomach. Low density abutting the gastric  lesser curvature is felt to represent necrotic node rather than gastric diverticulum. No bowel obstruction. Administered enteric contrast reaches the colon. No bowel wall thickening or inflammatory change. No evidence of colonic mass. Normal appendix. Vascular/Lymphatic: Abnormal bulky retroperitoneal adenopathy extending from the level of the renal vasculature to the iliac bifurcation. Some of these nodes demonstrate central low density consistent with necrosis. Representative nodal conglomerate in the left periaortic station at the level just below the renal veins measures 4.2 x 2.3 cm, series 2, image 74. Round soft tissue density with central low-density the upper abdomen abutting the gastric cardia/lesser curvature felt to represent a necrotic node measuring 2.0 x 1.9 cm, series 2, image 57. There are additional enlarged nodes in the porta hepatis and gastrohepatic ligament. No definite pelvic adenopathy. Advanced aortic atherosclerosis with irregular plaque in the mid aorta causing approximately 50% luminal narrowing. Limited assessment for portal vein and IVC patency given phase of contrast, however on delayed phase imaging, no obvious venous thrombus is seen. Reproductive: Prominent prostate gland spans 5.3 cm. Central prostatic calcifications. No obvious prostatic mass. Other: Bilateral retroperitoneal stranding, left greater than right. No significant ascites. No definite omental nodularity. No free air. Musculoskeletal: There are no acute or suspicious osseous abnormalities. Degenerative change in the lumbar spine. IMPRESSION: 1. Bulky retroperitoneal adenopathy, some of which appears necrotic with central low density. There also 2 enhancing lesions in the left lobe of the liver, in this setting are suspicious for metastatic disease. Primary malignancy not definitively characterize. Patient has achalasia, with irregular wall thickening of the distal esophagus, which could be a source of primary  malignancy. Consider further evaluation with endoscopy. Recommend oncologic referral. 2. Borderline prevascular nodes at 10 mm, nonspecific. 3. Common bile duct dilatation at 10 mm with prominence of the pancreatic duct of 4 mm. No obvious pancreatic mass or cause of obstruction. This could be further evaluated with MRCP, if patient is able to tolerate breath hold technique. 4. Thoracoabdominal aortic atherosclerosis with irregular plaque of the distal descending thoracic and infrarenal abdominal aorta. 5. Emphysema. 6. Mild patchy tree in bud opacities in the right lower lobe, likely infectious or inflammatory. 7. Bilateral perinephric edema which is nonspecific, and may be chronic. Aortic Atherosclerosis (ICD10-I70.0) and Emphysema (ICD10-J43.9). Electronically Signed   By: Keith Rake M.D.   On: 03/06/2019 14:58   Ct Abdomen Pelvis W Contrast  Result Date: 03/06/2019 CLINICAL DATA:  Unintended weight loss. Nonlocalized abdominal pain. Technologist notes state COPD, progressive dysphagia, esophageal diverticula and achalasia. G-tube placement 12/01/2018. Admitted for acute on chronic back pain malnutrition and anemia. EXAM: CT CHEST, ABDOMEN, AND PELVIS WITH CONTRAST TECHNIQUE: Multidetector CT imaging of the chest, abdomen and pelvis was performed following the standard protocol during bolus administration of intravenous contrast. CONTRAST:  61mL OMNIPAQUE IOHEXOL 300 MG/ML  SOLN COMPARISON:  Recent chest and spine radiographs, no prior cross-sectional imaging. FINDINGS: CT CHEST FINDINGS Cardiovascular: Atherosclerosis and tortuosity of the thoracic aorta. Irregular plaque in the distal descending thoracic aorta. No dissection or acute aortic syndrome. Heart is normal in size. No pericardial  effusion. There are coronary artery calcifications. No filling defects in the central most pulmonary arteries to the lobar level to suggest pulmonary embolus. Mediastinum/Nodes: Dilated fluid-filled esophagus  consistent with achalasia. Small focal outpouching from the distal esophagus, series 2, image 44, likely represents esophageal diverticulum. No inflammatory change or perforation. There is irregular wall thickening of the distal esophagus just proximal to the gastroesophageal junction. Borderline prevascular nodes measuring up to 10 mm. Small upper right paratracheal nodes, all subcentimeter. No enlarged hilar lymph nodes. No visualized thyroid nodule. Lungs/Pleura: Mild emphysema. Scattered areas of subpleural scarring in both lower lobes. Patchy area of tree in bud opacities in the superior segment of the right lower lobe, series 4, image 83. No pulmonary edema or confluent airspace disease. No pleural fluid. Trachea and mainstem bronchi are patent. No pulmonary mass. Musculoskeletal: There are no acute or suspicious osseous abnormalities. Scattered areas of calcification in the dorsal spinal canal without frank canal impingement. CT ABDOMEN PELVIS FINDINGS Hepatobiliary: There are 2 enhancing lesions in the left lobe of the liver, 17 and 16 mm both series 2, image 55. Punctate hepatic granuloma in the right lobe. Gallbladder physiologically distended, no calcified stone. Common bile duct is dilated at 10 mm. No visualized choledocholithiasis. Pancreas: Mildly dilated proximal pancreatic duct at 4 mm. No obvious focal pancreatic mass. No peripancreatic inflammation. Spleen: Normal in size. Splenule anteriorly. No evidence of focal lesion, allowing for arterial phase imaging. Adrenals/Urinary Tract: Normal right adrenal gland. Left adrenal thickening without dominant nodule. Hydronephrosis. There is bilateral perinephric edema. Homogeneous enhancement with symmetric excretion on delayed phase imaging. No evidence of focal renal mass. Urinary bladder is partially distended. No obvious bladder wall thickening. Stomach/Bowel: Gastrostomy tube with balloon appropriately positioned in the stomach. Low density abutting  the gastric lesser curvature is felt to represent necrotic node rather than gastric diverticulum. No bowel obstruction. Administered enteric contrast reaches the colon. No bowel wall thickening or inflammatory change. No evidence of colonic mass. Normal appendix. Vascular/Lymphatic: Abnormal bulky retroperitoneal adenopathy extending from the level of the renal vasculature to the iliac bifurcation. Some of these nodes demonstrate central low density consistent with necrosis. Representative nodal conglomerate in the left periaortic station at the level just below the renal veins measures 4.2 x 2.3 cm, series 2, image 74. Round soft tissue density with central low-density the upper abdomen abutting the gastric cardia/lesser curvature felt to represent a necrotic node measuring 2.0 x 1.9 cm, series 2, image 57. There are additional enlarged nodes in the porta hepatis and gastrohepatic ligament. No definite pelvic adenopathy. Advanced aortic atherosclerosis with irregular plaque in the mid aorta causing approximately 50% luminal narrowing. Limited assessment for portal vein and IVC patency given phase of contrast, however on delayed phase imaging, no obvious venous thrombus is seen. Reproductive: Prominent prostate gland spans 5.3 cm. Central prostatic calcifications. No obvious prostatic mass. Other: Bilateral retroperitoneal stranding, left greater than right. No significant ascites. No definite omental nodularity. No free air. Musculoskeletal: There are no acute or suspicious osseous abnormalities. Degenerative change in the lumbar spine. IMPRESSION: 1. Bulky retroperitoneal adenopathy, some of which appears necrotic with central low density. There also 2 enhancing lesions in the left lobe of the liver, in this setting are suspicious for metastatic disease. Primary malignancy not definitively characterize. Patient has achalasia, with irregular wall thickening of the distal esophagus, which could be a source of  primary malignancy. Consider further evaluation with endoscopy. Recommend oncologic referral. 2. Borderline prevascular nodes at 10 mm, nonspecific. 3.  Common bile duct dilatation at 10 mm with prominence of the pancreatic duct of 4 mm. No obvious pancreatic mass or cause of obstruction. This could be further evaluated with MRCP, if patient is able to tolerate breath hold technique. 4. Thoracoabdominal aortic atherosclerosis with irregular plaque of the distal descending thoracic and infrarenal abdominal aorta. 5. Emphysema. 6. Mild patchy tree in bud opacities in the right lower lobe, likely infectious or inflammatory. 7. Bilateral perinephric edema which is nonspecific, and may be chronic. Aortic Atherosclerosis (ICD10-I70.0) and Emphysema (ICD10-J43.9). Electronically Signed   By: Keith Rake M.D.   On: 03/06/2019 14:58   Dg Chest Portable 1 View  Result Date: 03/04/2019 CLINICAL DATA:  Weakness EXAM: PORTABLE CHEST 1 VIEW COMPARISON:  May 08, 2018 FINDINGS: Lungs are hyperexpanded. No edema or consolidation. Heart size and pulmonary vascularity are normal. No adenopathy. Gastrostomy catheter positioned in left upper quadrant. IMPRESSION: Lungs hyperexpanded. No edema or consolidation. Cardiac silhouette within normal limits. No adenopathy evident. Electronically Signed   By: Lowella Grip III M.D.   On: 03/04/2019 15:34    Labs:  CBC: Recent Labs    11/05/18 0956 03/04/19 1412 03/05/19 0525 03/06/19 0345  WBC 15.0* 19.3* 13.7* 10.7*  HGB 16.1 16.4 13.0 11.7*  HCT 46.1 50.2 40.5 37.1*  PLT 230 276 195 158    COAGS: Recent Labs    03/07/19 0345  INR 1.2    BMP: Recent Labs    03/04/19 1412 03/05/19 0525 03/06/19 0345 03/07/19 0345  NA 152* 154* 146* 136  K 3.6 3.4* 3.5 3.6  CL 105 114* 110 101  CO2 24 26 28 27   GLUCOSE 158* 123* 188* 171*  BUN 56* 47* 30* 19  CALCIUM 12.6* 11.0* 10.6* 9.3  CREATININE 1.54* 1.14 1.01 0.82  GFRNONAA 44* >60 >60 >60  GFRAA  51* >60 >60 >60    LIVER FUNCTION TESTS: Recent Labs    05/08/18 1459 11/05/18 0956 03/05/19 0525  BILITOT 0.9 1.4* 1.0  AST 28 15 17   ALT 20 15 14   ALKPHOS 42 58 68  PROT 8.3* 8.5* 7.7  ALBUMIN 3.9 4.1 3.3*    TUMOR MARKERS: No results for input(s): AFPTM, CEA, CA199, CHROMGRNA in the last 8760 hours.  Assessment and Plan:  Oscar Sanchez is a 73 y.o. male with history of COPD and severe protein calorie malnutrition, post gastrostomy tube placement who was found to have extensive thickening of the distal esophagus as well as porta hepatis and retroperitoneal lymphadenopathy worrisome for metastatic disease.  Additionally, patient was found to have ill-defined liver lesions also worrisome for metastatic disease and as such request made for ultrasound-guided liver lesion biopsy for tissue diagnostic purposes.  Risks and benefits of US guided liver lesion biopsy was discussed with the patient and/or patient's family including, but not limited to bleeding, infection, damage to adjacent structures or low yield requiring additional tests.  All of the questions were answered and there is agreement to proceed.  Consent signed and in chart.    Thank you for this interesting consult.  I greatly enjoyed meeting Cathy Fury and look forward to participating in their care.  A copy of this report was sent to the requesting provider on this date.  Electronically Signed: Sandi Mariscal, MD 03/07/2019, 12:41 PM   I spent a total of 20 Minutes in face to face in clinical consultation, greater than 50% of which was counseling/coordinating care for US guided liver lesion biopsy.

## 2019-03-07 NOTE — Assessment & Plan Note (Addendum)
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  Primary unclear-clinically quite possible esophagus.  See below  # Weight loss/poor p.o. intake-s/p PEG tube-achalasia/underlying possible malignancy-unfortunately portends poor prognosis/poor tolerance to therapy.  As per palliative care-patient can go on hospice; however tube feeds coverage might be an issue.  Awaiting social work input.  Discussed with Dr. Arbutus Ped.  #DNR/DNI.  #Patient today again states that he is not interested in any further therapy for his metastatic cancer-he states "God will cure him; and if not he will still go to heaven".  Reiterates that does not want to pursue any therapies.  Would not recommend PET scan/will not order NGS.  Recommend follow-up in the cancer center in approximately 2 weeks/post discharge.

## 2019-03-07 NOTE — Progress Notes (Signed)
PT Cancellation Note  Patient Details Name: Oscar Sanchez MRN: [id number] DOB: 1946/02/18   Cancelled Treatment:    Reason Eval/Treat Not Completed: Other (comment).  Pt currently off unit for procedure.  Will re-attempt PT treatment session at a later date/time.  Leitha Bleak, PT 03/07/19, 1:31 PM

## 2019-03-08 ENCOUNTER — Telehealth: Payer: Self-pay | Admitting: Internal Medicine

## 2019-03-08 DIAGNOSIS — Z515 Encounter for palliative care: Secondary | ICD-10-CM

## 2019-03-08 DIAGNOSIS — K769 Liver disease, unspecified: Secondary | ICD-10-CM

## 2019-03-08 DIAGNOSIS — Z7189 Other specified counseling: Secondary | ICD-10-CM

## 2019-03-08 DIAGNOSIS — Z66 Do not resuscitate: Secondary | ICD-10-CM

## 2019-03-08 LAB — PHOSPHORUS: Phosphorus: 2.7 mg/dL (ref 2.5–4.6)

## 2019-03-08 LAB — GLUCOSE, CAPILLARY
Glucose-Capillary: 154 mg/dL — ABNORMAL HIGH (ref 70–99)
Glucose-Capillary: 153 mg/dL — ABNORMAL HIGH (ref 70–99)
Glucose-Capillary: 157 mg/dL — ABNORMAL HIGH (ref 70–99)
Glucose-Capillary: 142 mg/dL — ABNORMAL HIGH (ref 70–99)
Glucose-Capillary: 157 mg/dL — ABNORMAL HIGH (ref 70–99)

## 2019-03-08 LAB — CBC
HCT: 33.9 % — ABNORMAL LOW (ref 39.0–52.0)
Hemoglobin: 11.4 g/dL — ABNORMAL LOW (ref 13.0–17.0)
MCH: 27.7 pg (ref 26.0–34.0)
MCHC: 33.6 g/dL (ref 30.0–36.0)
MCV: 82.5 fL (ref 80.0–100.0)
Platelets: 151 10*3/uL (ref 150–400)
RBC: 4.11 MIL/uL — ABNORMAL LOW (ref 4.22–5.81)
RDW: 13.4 % (ref 11.5–15.5)
WBC: 9.3 10*3/uL (ref 4.0–10.5)
nRBC: 0 % (ref 0.0–0.2)

## 2019-03-08 LAB — BASIC METABOLIC PANEL
Anion gap: 8 (ref 5–15)
BUN: 13 mg/dL (ref 8–23)
CO2: 26 mmol/L (ref 22–32)
Calcium: 9.4 mg/dL (ref 8.9–10.3)
Chloride: 99 mmol/L (ref 98–111)
Creatinine, Ser: 0.74 mg/dL (ref 0.61–1.24)
GFR calc Af Amer: >60 mL/min (ref >60)
GFR calc non Af Amer: >60 mL/min (ref >60)
Glucose, Bld: 158 mg/dL — ABNORMAL HIGH (ref 70–99)
Potassium: 3.9 mmol/L (ref 3.5–5.1)
Sodium: 133 mmol/L — ABNORMAL LOW (ref 135–145)

## 2019-03-08 LAB — LACTATE DEHYDROGENASE: LDH: 128 U/L (ref 98–192)

## 2019-03-08 LAB — SURGICAL PATHOLOGY

## 2019-03-08 LAB — MAGNESIUM: Magnesium: 2.2 mg/dL (ref 1.7–2.4)

## 2019-03-08 MED ORDER — DOCUSATE SODIUM 50 MG/5ML PO LIQD
100.0000 mg | Freq: Two times a day (BID) | ORAL | Status: DC
  Filled 2019-03-08 (×2): qty 10

## 2019-03-08 MED ORDER — DOCUSATE SODIUM 50 MG/5ML PO LIQD
100.0000 mg | Freq: Two times a day (BID) | ORAL | Status: DC
  Administered 2019-03-08 – 2019-03-13 (×8): 100 mg
  Filled 2019-03-08 (×11): qty 10

## 2019-03-08 NOTE — Consult Note (Addendum)
  MEDICATION RELATED CONSULT NOTE - FOLLOW UP   Pharmacy Consult for: Post IR Procedure Consult - Anticoagulant/Antiplatelet PTA/Inpatient Med List Review by Pharmacist Indication: High Bleeding Risk for Procedure  No Known Allergies  Labs: Recent Labs    03/06/19 0345 03/07/19 0345 03/08/19 0521  WBC 10.7*  --  9.3  HGB 11.7*  --  11.4*  HCT 37.1*  --  33.9*  PLT 158  --  151  CREATININE 1.01 0.82 0.74  MG 2.3  --  2.2  PHOS 2.0*  --  2.7   Estimated Creatinine Clearance: 54 mL/min (by C-G formula based on SCr of 0.74 mg/dL).   Assessment: Pharmacy consulted for resuming Enoxaparin s/p liver biopsy (high bleeding risk). Patient was previously on Enoxaparin 30 mg Q24H (d/t wt < 57 kg for males).   Goal of Therapy:  To prevent ADEs.   Plan:  1. Will restart enoxaparin 30 mg Q24h tomorrow at 1400. Pharmacy will sign-off (MD notified).    Rowland Lathe 03/08/2019,12:50 PM

## 2019-03-08 NOTE — Consult Note (Addendum)
Consultation Note Date: 03/08/2019   Patient Name: Oscar Sanchez  DOB: 07-29-1945  MRN: [id number]  Age / Sex: 73 y.o., male   PCP: Wardell Honour, MD Referring Physician: Ezekiel Slocumb, DO   REASON FOR CONSULTATION:Establishing goals of care  Palliative Care consult requested for goals of care discussion in this 73 y.o. male with multiple medical problems including esophageal diverticulum and achalasia status post PEG tube (12/01/2018), chronic low back and neck pain, esophageal spasm, nondependent alcohol abuse, dysphagia, refeeding syndrome, severe protein-calorie malnutrition, COPD. He presented to ED with complaints of increased pain, weakness, and intolerance to tube feedings. It was reported whenever he receives feeds he feels very nauseous and cannot continue with feedings. Normal regimen is 6 cans a day, but has decreased to 1 or only water flushes. He is not a surgical candidate to address esophageal issues unless he is able to gain 30 pounds or more. Since admission he has been started on a fentanyl patch for chronic pain. CT of chest, abdomen, pelvis showed multiple liver lesions, thickened esophagus, and retroperitoneal adenopathy. Biopsy performed on 03/07/19 due to questionable metastatic malignancy and Oncology has been consulted with a noted plan to await biopsy results for further conclusions.   Clinical Assessment and Goals of Care: I have reviewed medical records including lab results, imaging, Epic notes, and MAR, received report from the bedside RN, and assessed the patient. I met at the bedside with Oscar Sanchez to discuss diagnosis prognosis, GOC, EOL wishes, disposition and options. He is sitting up in the recliner s/p ambulating with PT this morning. He complains of abdominal and back pain. RN preparing to administer pain medication. He denies shortness of breath.   I introduced Palliative Medicine as specialized medical care for people living with serious illness. It  focuses on providing relief from the symptoms and stress of a serious illness. The goal is to improve quality of life for both the patient and the family.  We discussed a brief life review of the patient, along with his functional and nutritional status. Oscar Sanchez reports he lives alone. He has been divorced for many years with no children. He states he has 5 siblings (2 brothers deceased and 3 living sisters). 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 He states he enjoys singing and reading the bible. He speaks of his strong [place] faith.   He reports having the PEG tube placed about 2-3 months ago and follows up with his [facility] providers. He was able to perform all ADLs up until the week prior to admission. He uses a walker for assistance at times. Cares for his PEG in the home and states all items are delivered to his home.   We discussed His current illness and what it means in the larger context of His on-going co-morbidities. With specific discussions regarding his liver lesions/adenopathy, chronic pain, weight loss and his overall decline in functional and nutritional status. Natural disease trajectory and expectations at EOL were discussed.  Oscar Sanchez shares his misery prior to admission and throughout the last few months. He states he has loss over 60lbs this years. The weeks prior to admission he reports he was in "turmoil" at home suffering from severe pain (abdominal and back), weak, "agonizing nausea" everytime he would tried to feed, resulting in him only being able to tolerate small amounts of feedings.   He verbalizes  understanding of his current illness. He is tearful in expressing "I am just waiting to hear the biopsy results and see what my options are at that point! I am fearful there will be no options but also hopeful for better news!" Therapeutic listening and support provided. He began speaking of his  faith in God and how he suffered for our sins to give [place] life.   Oscar Sanchez states "he is sad because he is only 7 years and there is so much life he was hopeful for. He speaks of hopes that one day he would have had the chance to remarry and travel some! Making note that he is unsure this will happen now. Support provided.   I used this opportunity to allow him to further elaborate on his feelings regarding his health and quality of life. Oscar Sanchez states "my quality of life isn't the best but that doesn't mean it can't get better!" He states again he is hopeful for better news after pathology reports come back. He states he is not ready to "just throw in the towel." "Some way, some how, God is a healer and a provider. He will pull me through however it is going to be!" He is hopeful he can return home. He reports he is aware he possibly may have to go to SNF which he is accepting of but would prefer to go to his own home.   He states if he has the option to undergo treatment he would want to pursue and at least give himself a fighting chance. He reports his pain is "10 times better the past few days since receiving a better regimen and starting on the patch". He continues to have some breakthrough but much more tolerable. He shares how he laid in bed for several days with no sleep in agonizing pain and discomfort.   I attempted to elicit values and goals of care important to the patient.    The difference between aggressive medical intervention and comfort care was considered in light of the patient's goals of care. We discussed in details best case and worst case scenarios. Oscar Sanchez states he is remaining hopeful for the best (good biopsy result, treatment options, and ability to continue tolerating tube feedings). He understands his poor prognosis and the possibility of a more comfort and hospice approach, but again states he would like to take it one day at at time and have all of the information and  options.   Advanced directives, concepts specific to code status, artifical feeding and hydration, and rehospitalization were considered and discussed. Patient reports he does not have a documented advanced directive. He reports his close friend who is a former RN Denman George [phone]) would be his designated HCPOA and decision maker if he became unable to speak for himself. He reports his sister Leeanne Mannan is aware of this and Felipa Emory takes him to his appointments and is able to explain things to his sister. I educated Mr. Grandt about completing an AD while hospitalized to make his wishes knowing. He is aware without documentation his sisters would be decision makers on his behalf. He request information and would like to review before making a decision to complete. Advanced directive copy was left with patient.   Mr. Stetzer confirms wishes for DNR/DNI.   Hospice and Palliative Care services outpatient were explained and offered. Patient  verbalized his understanding and awareness of both palliative and hospice's goals and philosophy of care. He reports he not ready to accept  hospice but would like palliative. I educated Mr. Mckinnon he may transition his care to hospice at anytime by discussing with his medical team. He verbalized awareness and understanding.   Questions and concerns were addressed.  The family was encouraged to call with questions or concerns.  PMT will continue to support holistically.   SOCIAL HISTORY:     reports that he has quit smoking. He has never used smokeless tobacco. He reports that he does not drink alcohol or use drugs.  CODE STATUS: DNR  ADVANCE DIRECTIVES: Denman George (friend) per patient   SYMPTOM MANAGEMENT: per attending   Palliative Prophylaxis:   Aspiration, Bowel Regimen, Delirium Protocol, Eye Care, Frequent Pain Assessment, Oral Care and Turn Reposition  PSYCHO-SOCIAL/SPIRITUAL:  Support System: Family   Desire for further Chaplaincy support:Yes    Additional Recommendations (Limitations, Scope, Preferences):  Full Scope Treatment and DNR   PAST MEDICAL HISTORY: Past Medical History:  Diagnosis Date  . Esophageal diverticulum     PAST SURGICAL HISTORY: History reviewed. No pertinent surgical history.  ALLERGIES:  has No Known Allergies.   MEDICATIONS:  Current Facility-Administered Medications  Medication Dose Route Frequency Provider Last Rate Last Dose  . acetaminophen (TYLENOL) tablet 650 mg  650 mg Oral Q6H PRN Max Sane, MD   650 mg at 03/05/19 2327   Or  . acetaminophen (TYLENOL) suppository 650 mg  650 mg Rectal Q6H PRN Max Sane, MD      . albuterol (PROVENTIL) (2.5 MG/3ML) 0.083% nebulizer solution 2.5 mg  2.5 mg Nebulization Q6H PRN Rito Ehrlich A, RPH      . bisacodyl (DULCOLAX) EC tablet 5 mg  5 mg Oral Daily PRN Manuella Ghazi, Vipul, MD      . dextrose 5 % with KCl 20 mEq / L  infusion  20 mEq Intravenous Continuous Loletha Grayer, MD 75 mL/hr at 03/08/19 0907 20 mEq at 03/08/19 0907  . docusate sodium (COLACE) capsule 100 mg  100 mg Oral BID Max Sane, MD      . enoxaparin (LOVENOX) injection 30 mg  30 mg Subcutaneous Q24H Rowland Lathe, RPH      . feeding supplement (OSMOLITE 1.5 CAL) liquid 1,000 mL  1,000 mL Per Tube Continuous Loletha Grayer, MD 60 mL/hr at 03/08/19 0405 1,000 mL at 03/08/19 0405  . fentaNYL (DURAGESIC) 12 MCG/HR 1 patch  1 patch Transdermal Q72H Loletha Grayer, MD   1 patch at 03/06/19 1603  . free water 120 mL  120 mL Per Tube Q4H Loletha Grayer, MD   120 mL at 03/08/19 0357  . HYDROcodone-acetaminophen (HYCET) 7.5-325 mg/15 ml solution 10 mL  10 mL Per Tube Q6H PRN Loletha Grayer, MD   10 mL at 03/08/19 1011  . morphine 2 MG/ML injection 1 mg  1 mg Intravenous Q4H PRN Max Sane, MD   1 mg at 03/07/19 1314  . ondansetron (ZOFRAN) tablet 4 mg  4 mg Oral Q6H PRN Max Sane, MD       Or  . ondansetron (ZOFRAN) injection 4 mg  4 mg Intravenous Q6H PRN Max Sane, MD      .  pantoprazole (PROTONIX) EC tablet 40 mg  40 mg Oral Daily Loletha Grayer, MD   40 mg at 03/08/19 1010  . tiotropium (SPIRIVA) inhalation capsule ([facility] use ONLY) 18 mcg  18 mcg Inhalation Daily Manuella Ghazi, Vipul, MD      . traZODone (DESYREL) tablet 25 mg  25 mg Oral QHS PRN Max Sane, MD  VITAL SIGNS: BP (!) 86/58 (BP Location: Right Arm)   Pulse 94   Temp 98.2 F (36.8 C) (Oral)   Resp 16   Ht '5\' 9"'  (1.753 m)   Wt 46.4 kg   SpO2 98%   BMI 15.11 kg/m  Filed Weights   03/06/19 0500 03/07/19 0500 03/08/19 0409  Weight: 43.8 kg 46.3 kg 46.4 kg    Estimated body mass index is 15.11 kg/m as calculated from the following:   Height as of this encounter: '5\' 9"'  (1.753 m).   Weight as of this encounter: 46.4 kg.  LABS: CBC:    Component Value Date/Time   WBC 9.3 03/08/2019 0521   HGB 11.4 (L) 03/08/2019 0521   HCT 33.9 (L) 03/08/2019 0521   PLT 151 03/08/2019 0521   Comprehensive Metabolic Panel:    Component Value Date/Time   NA 133 (L) 03/08/2019 0521   K 3.9 03/08/2019 0521   CO2 26 03/08/2019 0521   BUN 13 03/08/2019 0521   CREATININE 0.74 03/08/2019 0521   ALBUMIN 3.3 (L) 03/05/2019 0525     Review of Systems  Constitutional: Positive for activity change, appetite change, fatigue and unexpected weight change.  Gastrointestinal: Positive for abdominal pain.  Musculoskeletal: Positive for back pain.  Neurological: Positive for weakness.  Unless otherwise noted, a complete review of systems is negative.  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   Discharge Planning:  To Be Determined  Recommendations:  DNR/DNI-as confirmed by patient  Continue with current plan of care per medical team  Patient remains hopeful for good results of biopsy allowing him to be a candidate for oncological interventions despite awareness of poor overall prognosis, performance, and nutritional state. States he would like to pursue all treatment options that are available to him (chemo, radiation, rehab).   Clear understanding of prognosis, requesting watchful waiting. Agreeable to palliative support outpatient but is not accepting of hospice at this time, but aware this may be the only option and/or coming in the future.   Offered spiritual support to assist with completion of AD given he states Felipa Emory (friend) would be his designated POA vs. His sister. Copy of document left with patient. He requested to read over it and would decide if he wanted to complete. Will notify RN if so.  PMT will continue to support and follow.    Palliative Performance Scale: PEG tube feedings/OOB               Patient expressed understanding and was in agreement with this plan.   Thank you for allowing the Palliative Medicine Team to assist in the care of this patient.  Time In: 1015 Time Out: 1110 Time Total: 55 min.   Visit consisted of counseling and education dealing with the complex and emotionally intense issues of symptom management and palliative care in the setting of serious and potentially life-threatening illness.Greater than 50%  of this time was spent counseling and coordinating care related to the  above assessment and plan.  Signed by:  Alda Lea, AGPCNP-BC Palliative  Medicine Team  Phone: [phone] Fax: [phone] Pager: [phone] Amion: Bjorn Pippin

## 2019-03-08 NOTE — Care Management Important Message (Signed)
Important Message  Patient Details  Name: Oscar Sanchez MRN: [id number] Date of Birth: 04-12-1945   Medicare Important Message Given:  Yes     [name] 03/08/2019, 11:06 AM

## 2019-03-08 NOTE — Progress Notes (Signed)
Physical Therapy Treatment Patient Details Name: Oscar Sanchez MRN: [id number] DOB: March 19, 1946 Today's Date: 03/08/2019    History of Present Illness Pt is a 73 y.o. male presenting to hospital 03/04/19 with back pain, nausea, weakness, and difficulty with PEG tube feedings.  Pt admitted with acute kidney injury, hypernatremia, sinus tachycardia, and acute on chronic back pain.  PMH includes esophageal diverticulum and achalasia s/p PEG tube, pulmonary emphysema, dysphagia, COPD, c-spine surgery.    PT Comments    Ready to get up and walk.  To edge of bed and gait with min guard/supervision to manage IV and tube feeding lines.  He is able to progress gait to 2 laps around unit with overall steady gait however on second lap he denied faitgue but changes in gait noted including increased pace and decreased step height and length noted.  Cues and education to slow down and use caution but he continued with pace.  Remained in recliner after session,   Follow Up Recommendations  Home health PT     Equipment Recommendations  Rolling walker with 5" wheels;3in1 (PT)    Recommendations for Other Services       Precautions / Restrictions Precautions Precautions: Fall Precaution Comments: NPO; PEG tube, ice chips allowed Restrictions Weight Bearing Restrictions: No    Mobility  Bed Mobility Overal bed mobility: Modified Independent;Needs Assistance             General bed mobility comments: assist to manage tube feeding and IV lines  Transfers Overall transfer level: Needs assistance Equipment used: Rolling walker (2 wheeled) Transfers: Sit to/from Stand Sit to Stand: Min guard            Ambulation/Gait   Gait Distance (Feet): 340 Feet Assistive device: Rolling walker (2 wheeled) Gait Pattern/deviations: Step-through pattern;Decreased step length - right;Decreased step length - left;Trunk flexed;Narrow base of support Gait velocity: decreased   General Gait Details:  generally steady with RW and 2 laps around unit.  some fatigue noted with increased pace and decreased step length and height causing some safety condern but no extra assist needed   Stairs             Wheelchair Mobility    Modified Rankin (Stroke Patients Only)       Balance Overall balance assessment: Needs assistance Sitting-balance support: Feet supported Sitting balance-Leahy Scale: Good     Standing balance support: Bilateral upper extremity supported Standing balance-Leahy Scale: Fair                              Cognition Arousal/Alertness: Awake/alert Behavior During Therapy: WFL for tasks assessed/performed Overall Cognitive Status: Within Functional Limits for tasks assessed                                 General Comments: Pt A&O x4, but still with some lack of insight into deficits/needs.      Exercises      General Comments        Pertinent Vitals/Pain Pain Assessment: 0-10 Pain Score: 6  Pain Descriptors / Indicators: Aching    Home Living                      Prior Function            PT Goals (current goals can now be found in the care plan section)  Progress towards PT goals: Progressing toward goals    Frequency    Min 2X/week      PT Plan Current plan remains appropriate    Co-evaluation              AM-PAC PT "6 Clicks" Mobility   Outcome Measure  Help needed turning from your back to your side while in a flat bed without using bedrails?: None Help needed moving from lying on your back to sitting on the side of a flat bed without using bedrails?: None Help needed moving to and from a bed to a chair (including a wheelchair)?: A Little Help needed standing up from a chair using your arms (e.g., wheelchair or bedside chair)?: A Little Help needed to walk in hospital room?: A Little Help needed climbing 3-5 steps with a railing? : A Little 6 Click Score: 20    End of Session  Equipment Utilized During Treatment: Gait belt Activity Tolerance: Patient tolerated treatment well Patient left: in chair;with call bell/phone within reach;with chair alarm set Nurse Communication: Mobility status       Time: [id number] PT Time Calculation (min) (ACUTE ONLY): 13 min  Charges:  $Gait Training: 8-22 mins                    Chesley Noon, PTA 03/08/19, 9:35 AM

## 2019-03-08 NOTE — Progress Notes (Signed)
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  Patient had been unable to tolerate PEG tube feeds for several days prior to admission, due to intractable nausea.  Palliative care consulted.  Patient not surgical candidate to address esophageal issues unless able to gain 30 pounds.    Subjective 12/3: Patient awake, up in chair.  No acute events reported overnight.  Patient reports pain better controlled today, seems Fentanyl patch has set in. He reports currently tolerating trickle tube feeds.  Denies fever/chills, N/V, chest pain or SOB.  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 intake - monitor renal function  Hypernatremia - resolved with fluids Likely due to dehydration from poor p.o. intake.  Continue D5W for now as still not taking in much.  Sinus tachycardia -Likely from severe back pain -We will order pain medicine and get x-ray of his back,monitor his heart  rate  Acute on chronic back pain Getthoracolumbarx-rays,consider MRI if pain continues or clinical worsening  Protein-calorie malnutrition, severe  - dietician consulted - high risk for refeeding syndrome -previously, at home he was to takeNutren 1.5 to six cans per day   Esophageal diverticulum/achalasia -followed at [facility] -goal of 30 pound weight gain to be a surgical candidate.  Iron deficiency anemia due to chronic blood loss -monitor blood counts  Chronic Constipation - lactulose as needed  Chronic Pain - suspect due to malignancy, primary to be determined.  Started low-dose fentanyl patch.  PRN liquid morphine via PEG tube.  Generalized Weakness - secondary to malnutrition and poor PO intake   DVT prophylaxis: Lovenox   Code Status: DNR  Family Communication: none at bedside  Disposition Plan:  Pending, expect he may need SNF or HH PT on discharge.  Expect medically ready in 1-2 days.   Consultants:   Palliative Care  Interventional Radiology  Oncology  Procedures:   U/S guided liver biospy 12/2  Antimicrobials:   None    Objective: Vitals:   03/07/19 1634 03/07/19 1635 03/08/19 0142 03/08/19 0409  BP: 95/63 95/63 (!) 90/57   Pulse: 79 79 80   Resp: 17 17 17    Temp: 98.7 F (37.1 C) 98.7 F (37.1 C) 98.9 F (37.2 C)   TempSrc: Oral Oral Oral   SpO2: 98% 98% 97%   Weight:    46.4 kg  Height:        Intake/Output Summary (Last 24 hours) at 03/08/2019 0820 Last data filed at 03/08/2019 [id number] Gross per 24 hour  Intake 2801.31 ml  Output 2050 ml  Net 751.31 ml   Filed Weights   03/06/19 0500 03/07/19 0500 03/08/19 0409  Weight: 43.8 kg 46.3 kg 46.4 kg    Examination:  General exam: awake, alert, no acute distress, frail, underweight HEENT: moist mucus membranes, hearing grossly normal  Respiratory system: clear to auscultation bilaterally, no wheezes, rales or rhonchi, normal respiratory effort. Cardiovascular system: normal  S1/S2, RRR, no JVD, murmurs, rubs, gallops, no pedal edema.   Gastrointestinal system: soft, non-tender, non-distended abdomen Central nervous system: alert and oriented x4. no gross focal neurologic deficits, normal speech Extremities: moves all, no edema, normal tone Skin: dry, intact, normal temperature Psychiatry: normal mood, congruent affect, judgement and insight appear normal    Data Reviewed: I have personally reviewed following labs and imaging studies  CBC: Recent Labs  Lab 03/04/19 1412 03/05/19 0525 03/06/19 0345 03/08/19 0521  WBC 19.3* 13.7* 10.7* 9.3  HGB 16.4 13.0 11.7* 11.4*  HCT 50.2 40.5 37.1* 33.9*  MCV 85.2 86.5 88.8 82.5  PLT 276 195 158 [id number]   Basic Metabolic Panel: Recent Labs  Lab 03/04/19 1412 03/05/19 0525 03/06/19 0345 03/07/19 0345 03/08/19 0521  NA 152* 154* 146* 136 133*  K 3.6 3.4* 3.5 3.6 3.9  CL 105 114* 110 101 99  CO2 24 26 28 27 26   GLUCOSE 158* 123* 188* 171* 158*  BUN 56* 47* 30* 19 13  CREATININE 1.54* 1.14 1.01 0.82 0.74  CALCIUM 12.6* 11.0* 10.6* 9.3 9.4  MG  --   --  2.3  --  2.2  PHOS  --   --  2.0*  --  2.7   GFR: Estimated Creatinine Clearance: 54 mL/min (by C-G formula based on SCr of 0.74 mg/dL). Liver Function Tests: Recent Labs  Lab 03/05/19 0525  AST 17  ALT 14  ALKPHOS 68  BILITOT 1.0  PROT 7.7  ALBUMIN 3.3*   No results for input(s): LIPASE, AMYLASE in the last 168 hours. No results for input(s): AMMONIA in the last 168 hours. Coagulation Profile: Recent Labs  Lab 03/07/19 0345  INR 1.2   Cardiac Enzymes: No results for input(s): CKTOTAL, CKMB, CKMBINDEX, TROPONINI in the last 168 hours. BNP (last 3 results) No results for input(s): PROBNP in the last 8760 hours. HbA1C: No results for input(s): HGBA1C in the last 72 hours. CBG: Recent Labs  Lab 03/07/19 0828 03/07/19 1427 03/07/19 2117 03/08/19 0106 03/08/19 0355  GLUCAP 129* 115* 166* 154* 153*   Lipid Profile: No results for  input(s): CHOL, HDL, LDLCALC, TRIG, CHOLHDL, LDLDIRECT in the last 72 hours. Thyroid Function Tests: Recent Labs    03/06/19 0345  TSH 0.246*   Anemia Panel: Recent Labs    03/06/19 0345  VITAMINB12 1,161*   Sepsis Labs: Recent Labs  Lab 03/04/19 1412 03/04/19 1515 03/04/19 1715 03/05/19 0525 03/06/19 0345  PROCALCITON <0.10  --   --  <0.10 <0.10  LATICACIDVEN  --  2.6* 2.6*  --   --     Recent Results (from the past 240 hour(s))  Culture, blood (routine x 2)     Status: None (Preliminary result)   Collection Time: 03/04/19  3:15 PM   Specimen: BLOOD  Result Value Ref Range Status   Specimen Description BLOOD RIGHT ANTECUBITAL  Final   Special Requests   Final    BOTTLES DRAWN AEROBIC AND ANAEROBIC Blood Culture results may not be optimal due to an inadequate volume of blood received in culture bottles   Culture   Final  NO GROWTH 4 DAYS Performed at [facility], [address] [place], [place] [address]    Report Status PENDING  Incomplete  Culture, blood (routine x 2)     Status: None (Preliminary result)   Collection Time: 03/04/19  5:42 PM   Specimen: BLOOD  Result Value Ref Range Status   Specimen Description BLOOD FOREARM  Final   Special Requests   Final    BOTTLES DRAWN AEROBIC AND ANAEROBIC Blood Culture adequate volume   Culture   Final    NO GROWTH 4 DAYS Performed at [facility], [address], [place], [place] [address]    Report Status PENDING  Incomplete  SARS CORONAVIRUS 2 (TAT 6-24 HRS) Nasopharyngeal Nasopharyngeal Swab     Status: None   Collection Time: 03/04/19  6:25 PM   Specimen: Nasopharyngeal Swab  Result Value Ref Range Status   SARS Coronavirus 2 NEGATIVE NEGATIVE Final    Comment: (NOTE) SARS-CoV-2 target nucleic acids are NOT DETECTED. The SARS-CoV-2 RNA is generally detectable in upper and lower respiratory specimens during the acute phase of infection. Negative results do not preclude SARS-CoV-2  infection, do not rule out co-infections with other pathogens, and should not be used as the sole basis for treatment or other patient management decisions. Negative results must be combined with clinical observations, patient history, and epidemiological information. The expected result is Negative. Fact Sheet for Patients: SugarRoll.be Fact Sheet for Healthcare Providers: https://www.woods-mathews.com/ This test is not yet approved or cleared by the [place] FDA and  has been authorized for detection and/or diagnosis of SARS-CoV-2 by FDA under an Emergency Use Authorization (EUA). This EUA will remain  in effect (meaning this test can be used) for the duration of the COVID-19 declaration under Section 56 4(b)(1) of the Act, 21 U.S.C. section 360bbb-3(b)(1), unless the authorization is terminated or revoked sooner. Performed at [facility], [place] [address] [place], [place] [address]          Radiology Studies: Ct Chest W Contrast  Result Date: 03/06/2019 CLINICAL DATA:  Unintended weight loss. Nonlocalized abdominal pain. Technologist notes state COPD, progressive dysphagia, esophageal diverticula and achalasia. G-tube placement 12/01/2018. Admitted for acute on chronic back pain malnutrition and anemia. EXAM: CT CHEST, ABDOMEN, AND PELVIS WITH CONTRAST TECHNIQUE: Multidetector CT imaging of the chest, abdomen and pelvis was performed following the standard protocol during bolus administration of intravenous contrast. CONTRAST:  32mL OMNIPAQUE IOHEXOL 300 MG/ML  SOLN COMPARISON:  Recent chest and spine radiographs, no prior cross-sectional imaging. FINDINGS: CT CHEST FINDINGS Cardiovascular: Atherosclerosis and tortuosity of the thoracic aorta. Irregular plaque in the distal descending thoracic aorta. No dissection or acute aortic syndrome. Heart is normal in size. No pericardial effusion. There are coronary artery calcifications. No  filling defects in the central most pulmonary arteries to the lobar level to suggest pulmonary embolus. Mediastinum/Nodes: Dilated fluid-filled esophagus consistent with achalasia. Small focal outpouching from the distal esophagus, series 2, image 44, likely represents esophageal diverticulum. No inflammatory change or perforation. There is irregular wall thickening of the distal esophagus just proximal to the gastroesophageal junction. Borderline prevascular nodes measuring up to 10 mm. Small upper right paratracheal nodes, all subcentimeter. No enlarged hilar lymph nodes. No visualized thyroid nodule. Lungs/Pleura: Mild emphysema. Scattered areas of subpleural scarring in both lower lobes. Patchy area of tree in bud opacities in the superior segment of the right lower lobe, series 4, image 83. No pulmonary edema or confluent airspace disease. No pleural fluid.  Trachea and mainstem bronchi are patent. No pulmonary mass. Musculoskeletal: There are no acute or suspicious osseous abnormalities. Scattered areas of calcification in the dorsal spinal canal without frank canal impingement. CT ABDOMEN PELVIS FINDINGS Hepatobiliary: There are 2 enhancing lesions in the left lobe of the liver, 17 and 16 mm both series 2, image 55. Punctate hepatic granuloma in the right lobe. Gallbladder physiologically distended, no calcified stone. Common bile duct is dilated at 10 mm. No visualized choledocholithiasis. Pancreas: Mildly dilated proximal pancreatic duct at 4 mm. No obvious focal pancreatic mass. No peripancreatic inflammation. Spleen: Normal in size. Splenule anteriorly. No evidence of focal lesion, allowing for arterial phase imaging. Adrenals/Urinary Tract: Normal right adrenal gland. Left adrenal thickening without dominant nodule. Hydronephrosis. There is bilateral perinephric edema. Homogeneous enhancement with symmetric excretion on delayed phase imaging. No evidence of focal renal mass. Urinary bladder is partially  distended. No obvious bladder wall thickening. Stomach/Bowel: Gastrostomy tube with balloon appropriately positioned in the stomach. Low density abutting the gastric lesser curvature is felt to represent necrotic node rather than gastric diverticulum. No bowel obstruction. Administered enteric contrast reaches the colon. No bowel wall thickening or inflammatory change. No evidence of colonic mass. Normal appendix. Vascular/Lymphatic: Abnormal bulky retroperitoneal adenopathy extending from the level of the renal vasculature to the iliac bifurcation. Some of these nodes demonstrate central low density consistent with necrosis. Representative nodal conglomerate in the left periaortic station at the level just below the renal veins measures 4.2 x 2.3 cm, series 2, image 74. Round soft tissue density with central low-density the upper abdomen abutting the gastric cardia/lesser curvature felt to represent a necrotic node measuring 2.0 x 1.9 cm, series 2, image 57. There are additional enlarged nodes in the porta hepatis and gastrohepatic ligament. No definite pelvic adenopathy. Advanced aortic atherosclerosis with irregular plaque in the mid aorta causing approximately 50% luminal narrowing. Limited assessment for portal vein and IVC patency given phase of contrast, however on delayed phase imaging, no obvious venous thrombus is seen. Reproductive: Prominent prostate gland spans 5.3 cm. Central prostatic calcifications. No obvious prostatic mass. Other: Bilateral retroperitoneal stranding, left greater than right. No significant ascites. No definite omental nodularity. No free air. Musculoskeletal: There are no acute or suspicious osseous abnormalities. Degenerative change in the lumbar spine. IMPRESSION: 1. Bulky retroperitoneal adenopathy, some of which appears necrotic with central low density. There also 2 enhancing lesions in the left lobe of the liver, in this setting are suspicious for metastatic disease. Primary  malignancy not definitively characterize. Patient has achalasia, with irregular wall thickening of the distal esophagus, which could be a source of primary malignancy. Consider further evaluation with endoscopy. Recommend oncologic referral. 2. Borderline prevascular nodes at 10 mm, nonspecific. 3. Common bile duct dilatation at 10 mm with prominence of the pancreatic duct of 4 mm. No obvious pancreatic mass or cause of obstruction. This could be further evaluated with MRCP, if patient is able to tolerate breath hold technique. 4. Thoracoabdominal aortic atherosclerosis with irregular plaque of the distal descending thoracic and infrarenal abdominal aorta. 5. Emphysema. 6. Mild patchy tree in bud opacities in the right lower lobe, likely infectious or inflammatory. 7. Bilateral perinephric edema which is nonspecific, and may be chronic. Aortic Atherosclerosis (ICD10-I70.0) and Emphysema (ICD10-J43.9). Electronically Signed   By: Keith Rake M.D.   On: 03/06/2019 14:58   Ct Abdomen Pelvis W Contrast  Result Date: 03/06/2019 CLINICAL DATA:  Unintended weight loss. Nonlocalized abdominal pain. Technologist notes state COPD, progressive dysphagia, esophageal  diverticula and achalasia. G-tube placement 12/01/2018. Admitted for acute on chronic back pain malnutrition and anemia. EXAM: CT CHEST, ABDOMEN, AND PELVIS WITH CONTRAST TECHNIQUE: Multidetector CT imaging of the chest, abdomen and pelvis was performed following the standard protocol during bolus administration of intravenous contrast. CONTRAST:  50mL OMNIPAQUE IOHEXOL 300 MG/ML  SOLN COMPARISON:  Recent chest and spine radiographs, no prior cross-sectional imaging. FINDINGS: CT CHEST FINDINGS Cardiovascular: Atherosclerosis and tortuosity of the thoracic aorta. Irregular plaque in the distal descending thoracic aorta. No dissection or acute aortic syndrome. Heart is normal in size. No pericardial effusion. There are coronary artery calcifications. No  filling defects in the central most pulmonary arteries to the lobar level to suggest pulmonary embolus. Mediastinum/Nodes: Dilated fluid-filled esophagus consistent with achalasia. Small focal outpouching from the distal esophagus, series 2, image 44, likely represents esophageal diverticulum. No inflammatory change or perforation. There is irregular wall thickening of the distal esophagus just proximal to the gastroesophageal junction. Borderline prevascular nodes measuring up to 10 mm. Small upper right paratracheal nodes, all subcentimeter. No enlarged hilar lymph nodes. No visualized thyroid nodule. Lungs/Pleura: Mild emphysema. Scattered areas of subpleural scarring in both lower lobes. Patchy area of tree in bud opacities in the superior segment of the right lower lobe, series 4, image 83. No pulmonary edema or confluent airspace disease. No pleural fluid. Trachea and mainstem bronchi are patent. No pulmonary mass. Musculoskeletal: There are no acute or suspicious osseous abnormalities. Scattered areas of calcification in the dorsal spinal canal without frank canal impingement. CT ABDOMEN PELVIS FINDINGS Hepatobiliary: There are 2 enhancing lesions in the left lobe of the liver, 17 and 16 mm both series 2, image 55. Punctate hepatic granuloma in the right lobe. Gallbladder physiologically distended, no calcified stone. Common bile duct is dilated at 10 mm. No visualized choledocholithiasis. Pancreas: Mildly dilated proximal pancreatic duct at 4 mm. No obvious focal pancreatic mass. No peripancreatic inflammation. Spleen: Normal in size. Splenule anteriorly. No evidence of focal lesion, allowing for arterial phase imaging. Adrenals/Urinary Tract: Normal right adrenal gland. Left adrenal thickening without dominant nodule. Hydronephrosis. There is bilateral perinephric edema. Homogeneous enhancement with symmetric excretion on delayed phase imaging. No evidence of focal renal mass. Urinary bladder is partially  distended. No obvious bladder wall thickening. Stomach/Bowel: Gastrostomy tube with balloon appropriately positioned in the stomach. Low density abutting the gastric lesser curvature is felt to represent necrotic node rather than gastric diverticulum. No bowel obstruction. Administered enteric contrast reaches the colon. No bowel wall thickening or inflammatory change. No evidence of colonic mass. Normal appendix. Vascular/Lymphatic: Abnormal bulky retroperitoneal adenopathy extending from the level of the renal vasculature to the iliac bifurcation. Some of these nodes demonstrate central low density consistent with necrosis. Representative nodal conglomerate in the left periaortic station at the level just below the renal veins measures 4.2 x 2.3 cm, series 2, image 74. Round soft tissue density with central low-density the upper abdomen abutting the gastric cardia/lesser curvature felt to represent a necrotic node measuring 2.0 x 1.9 cm, series 2, image 57. There are additional enlarged nodes in the porta hepatis and gastrohepatic ligament. No definite pelvic adenopathy. Advanced aortic atherosclerosis with irregular plaque in the mid aorta causing approximately 50% luminal narrowing. Limited assessment for portal vein and IVC patency given phase of contrast, however on delayed phase imaging, no obvious venous thrombus is seen. Reproductive: Prominent prostate gland spans 5.3 cm. Central prostatic calcifications. No obvious prostatic mass. Other: Bilateral retroperitoneal stranding, left greater than right. No significant  ascites. No definite omental nodularity. No free air. Musculoskeletal: There are no acute or suspicious osseous abnormalities. Degenerative change in the lumbar spine. IMPRESSION: 1. Bulky retroperitoneal adenopathy, some of which appears necrotic with central low density. There also 2 enhancing lesions in the left lobe of the liver, in this setting are suspicious for metastatic disease. Primary  malignancy not definitively characterize. Patient has achalasia, with irregular wall thickening of the distal esophagus, which could be a source of primary malignancy. Consider further evaluation with endoscopy. Recommend oncologic referral. 2. Borderline prevascular nodes at 10 mm, nonspecific. 3. Common bile duct dilatation at 10 mm with prominence of the pancreatic duct of 4 mm. No obvious pancreatic mass or cause of obstruction. This could be further evaluated with MRCP, if patient is able to tolerate breath hold technique. 4. Thoracoabdominal aortic atherosclerosis with irregular plaque of the distal descending thoracic and infrarenal abdominal aorta. 5. Emphysema. 6. Mild patchy tree in bud opacities in the right lower lobe, likely infectious or inflammatory. 7. Bilateral perinephric edema which is nonspecific, and may be chronic. Aortic Atherosclerosis (ICD10-I70.0) and Emphysema (ICD10-J43.9). Electronically Signed   By: Keith Rake M.D.   On: 03/06/2019 14:58   US Biopsy (liver)  Result Date: 03/07/2019 INDICATION: No known primary, now with thickening of the distal esophagus and associated adenopathy and ill-defined lesions within the left lobe of the liver worrisome for metastatic disease. As such, request made for ultrasound-guided liver lesion biopsy for tissue diagnostic purposes. EXAM: ULTRASOUND GUIDED LIVER LESION BIOPSY COMPARISON:  CT of the chest, abdomen and pelvis-03/06/2019 MEDICATIONS: None ANESTHESIA/SEDATION: Fentanyl 50 mcg IV; Versed 1 mg IV Total Moderate Sedation time:  10 Minutes. The patient's level of consciousness and vital signs were monitored continuously by radiology nursing throughout the procedure under my direct supervision. COMPLICATIONS: None immediate. PROCEDURE: Informed written consent was obtained from the patient after a discussion of the risks, benefits and alternatives to treatment. The patient understands and consents the procedure. A timeout was performed  prior to the initiation of the procedure. Ultrasound scanning was performed of the right upper abdominal quadrant demonstrates an approximately 1.5 x 1.5 lesion within the subcapsular aspect the left lobe of the liver correlating with the lesion seen on preceding abdominal CT image 55, series 2). The procedure was planned. The midline of the abdomen was prepped and draped in the usual sterile fashion. The overlying soft tissues were anesthetized with 1% lidocaine with epinephrine. A 17 gauge, 6.8 cm co-axial needle was advanced into a peripheral aspect of the lesion. This was followed by 4 core biopsies with an 18 gauge core device under direct ultrasound guidance. Multiple ultrasound images were saved for procedural documentation purposes. The coaxial needle tract was embolized with a small amount of Gel-Foam slurry and superficial hemostasis was obtained with manual compression. Post procedural scanning was negative for definitive area of hemorrhage or additional complication. A dressing was placed. The patient tolerated the procedure well without immediate post procedural complication. IMPRESSION: Technically successful ultrasound guided core needle biopsy of indeterminate lesion within the subcapsular aspect of the left lobe of the liver. Electronically Signed   By: Sandi Mariscal M.D.   On: 03/07/2019 12:51        Scheduled Meds: . docusate sodium  100 mg Oral BID  . enoxaparin (LOVENOX) injection  30 mg Subcutaneous Q24H  . fentaNYL  1 patch Transdermal Q72H  . free water  120 mL Per Tube Q4H  . pantoprazole  40 mg Oral Daily  .  tiotropium  18 mcg Inhalation Daily   Continuous Infusions: . dextrose 5 % with KCl 20 mEq / L 20 mEq (03/07/19 1819)  . feeding supplement (OSMOLITE 1.5 CAL) 1,000 mL (03/08/19 0405)     LOS: 3 days    Time spent: 30-35 minutes    Ezekiel Slocumb, DO [facility] Pager: [phone]  If 7PM-7AM, please contact night-coverage [email or web address] Password  [email or web address] 03/08/2019, 8:20 AM

## 2019-03-08 NOTE — Progress Notes (Signed)
Oscar Sanchez   DOB:[birth year]   [id number]    Subjective: Patient complaining of continued back pain.  Overall he feels poorly.  Poor appetite.  Positive for nausea no vomiting.  No fevers or chills.  Objective:  Vitals:   03/08/19 0908 03/08/19 1600  BP: (!) 86/58 (!) 90/58  Pulse: 94 77  Resp: 16 18  Temp: 98.2 F (36.8 C) 98.4 F (36.9 C)  SpO2: 98% 97%     Intake/Output Summary (Last 24 hours) at 03/08/2019 1744 Last data filed at 03/08/2019 1703 Gross per 24 hour  Intake 3364.61 ml  Output 2350 ml  Net 1014.61 ml    Physical Exam  Constitutional: He is oriented to person, place, and time.  Cachectic appearing Caucasian male patient.  HENT:  Head: Normocephalic and atraumatic.  Mouth/Throat: Oropharynx is clear and moist. No oropharyngeal exudate.  Eyes: Pupils are equal, round, and reactive to light.  Neck: Normal range of motion. Neck supple.  Cardiovascular: Normal rate and regular rhythm.  Pulmonary/Chest: No respiratory distress. He has no wheezes.  Decreased air entry bilaterally.  Abdominal: Soft. Bowel sounds are normal. He exhibits no distension and no mass. There is no abdominal tenderness. There is no rebound and no guarding.  Positive for PEG tube  Musculoskeletal: Normal range of motion.        General: No tenderness or edema.  Neurological: He is alert and oriented to person, place, and time.  Skin: Skin is warm.  Psychiatric: Affect normal.     Labs:  Lab Results  Component Value Date   WBC 9.3 03/08/2019   HGB 11.4 (L) 03/08/2019   HCT 33.9 (L) 03/08/2019   MCV 82.5 03/08/2019   PLT 151 03/08/2019    Lab Results  Component Value Date   NA 133 (L) 03/08/2019   K 3.9 03/08/2019   CL 99 03/08/2019   CO2 26 03/08/2019    Studies:  US Biopsy (liver)  Result Date: 03/07/2019 INDICATION: No known primary, now with thickening of the distal esophagus and associated adenopathy and ill-defined lesions within the left lobe of the liver  worrisome for metastatic disease. As such, request made for ultrasound-guided liver lesion biopsy for tissue diagnostic purposes. EXAM: ULTRASOUND GUIDED LIVER LESION BIOPSY COMPARISON:  CT of the chest, abdomen and pelvis-03/06/2019 MEDICATIONS: None ANESTHESIA/SEDATION: Fentanyl 50 mcg IV; Versed 1 mg IV Total Moderate Sedation time:  10 Minutes. The patient's level of consciousness and vital signs were monitored continuously by radiology nursing throughout the procedure under my direct supervision. COMPLICATIONS: None immediate. PROCEDURE: Informed written consent was obtained from the patient after a discussion of the risks, benefits and alternatives to treatment. The patient understands and consents the procedure. A timeout was performed prior to the initiation of the procedure. Ultrasound scanning was performed of the right upper abdominal quadrant demonstrates an approximately 1.5 x 1.5 lesion within the subcapsular aspect the left lobe of the liver correlating with the lesion seen on preceding abdominal CT image 55, series 2). The procedure was planned. The midline of the abdomen was prepped and draped in the usual sterile fashion. The overlying soft tissues were anesthetized with 1% lidocaine with epinephrine. A 17 gauge, 6.8 cm co-axial needle was advanced into a peripheral aspect of the lesion. This was followed by 4 core biopsies with an 18 gauge core device under direct ultrasound guidance. Multiple ultrasound images were saved for procedural documentation purposes. The coaxial needle tract was embolized with a small amount of Gel-Foam slurry and  superficial hemostasis was obtained with manual compression. Post procedural scanning was negative for definitive area of hemorrhage or additional complication. A dressing was placed. The patient tolerated the procedure well without immediate post procedural complication. IMPRESSION: Technically successful ultrasound guided core needle biopsy of indeterminate  lesion within the subcapsular aspect of the left lobe of the liver. Electronically Signed   By: Sandi Mariscal M.D.   On: 03/07/2019 12:51    Liver lesion, left lobe #73 year old male patient with recent diagnosis of achalasia/weight loss-s/p PEG tube; is currently admitted to hospital for dehydration/acute renal failure; noted to have liver lesion bulky retroperitoneal adenopathy  #Liver lesions/bulky retroperitoneal adenopathy- It is highly concerning for malignancy-which seems to be attributing to his overall pain/continue weight loss.  Awaiting biopsy results today.  # Weight loss/poor p.o. intake-s/p PEG tube-achalasia/underlying possible malignancy-unfortunately portends poor prognosis/poor tolerance to therapy.   #DNR/DNI.  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  No clear primary source noted on imaging; question esophagus/diverticulum.  Discussed with Dr. Dicie Beam pathology.  Discussed with Dr. Arbutus Ped.  Also discussed with Dr[facility]Joshua Spaetz at [facility]; who performed the last EGD in September.  No obvious malignancy noted.  I will reach out to patient's sister re: overall poor prognosis/ treatment options.    Cammie Sickle, MD 03/08/2019  5:44 PM

## 2019-03-08 NOTE — Telephone Encounter (Signed)
I spoke to patient's sister Elyssa-the diagnosis of malignancy-squamous cell; unclear primary at this time.  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   # Discussed that patient has advanced/stage IV cancer; median survival is an average 1 year.  However, given patient's cachexia/debilitated state/poor performance status-patient's survival might not reach the median survival.  Unfortunately prognosis is poor.  #Understand that he might need to go to rehab.  Understands that patient will need a PET scan.  Will discuss with the patient tomorrow regarding his wishes for work-up/treatment.  #Sister does not want information passed to person named "Allona".

## 2019-03-08 NOTE — Progress Notes (Signed)
Nutrition Follow-up  DOCUMENTATION CODES:   Severe malnutrition in context of chronic illness, Underweight  INTERVENTION:  Advance to Osmolite 1.5 Cal at 60 mL/hr. Provides 2160 kcal, 90 grams of protein, 1094 mL H2O daily.  Continue free water flush of 120 mL Q4hrs. This will provide a total of 1814 mL H2O daily including the water patient will receive in TF regimen.  Goal TF regimen meets 100% RDIs for vitamins/minerals.  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  Ongoing.  GOAL:   Patient will meet greater than or equal to 90% of their needs  Met with TF regimen.  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   -Patient underwent CT chest and CT abdomen pelvis on 12/1. Findings include bulky retroperitoneal adenopathy and 2 enhancing lesions on left lobe of the liver concerning for metastatic disease. -On 12/2 underwent US guided core needle biopsy of liver lesion.  Met with patient and his friend at bedside. Patient reports he has been tolerating the tube feeds well. He reports he had gotten up to goal tube feed regimen yesterday and was tolerating. Tube feeds were paused after his liver biopsy and then resumed at 20 mL/hr instead of his goal rate. He is currently receiving tube feeds at 30 mL/hr. Discussed with RN okay to resume tube  feeds at goal rate after pausing for procedures. Patient wants to continue feeds over 24 hours for now before considering intermittent feeds. Will monitor disposition plan.  Enteral Access: G-tube present on admission  Medications reviewed and include: Colace 100 mg BID, fentanyl patch, pantoprazole, D5W with KCl 20 mEq/L at 75 mL/hr.  Labs reviewed: CBG 153-166, Sodium 133.  Diet Order:   Diet Order            Diet NPO time specified  Diet effective now             EDUCATION NEEDS:   No education needs have been identified at this time  Skin:  Skin Assessment: Reviewed RN Assessment  Last BM:  03/06/2019 per chart  Height:   Ht Readings from Last 1 Encounters:  03/04/19 _0  (1.753 m)   Weight:   Wt Readings from Last 1 Encounters:  03/08/19 46.4 kg   Ideal Body Weight:  72.7 kg  BMI:  Body mass index is 15.11 kg/m.  Estimated Nutritional Needs:   Kcal:  1530-1750 (35-40 kcal/kg); though pt receiving approximately 50 kcal/kg for wt gain  Protein:  77-87 grams (1.8-2 grams/kg)  Fluid:  1.5-1.8 L/day  Jacklynn Barnacle, MS, RD, LDN Office: [phone] Pager: [phone] After Hours/Weekend Pager: [phone]

## 2019-03-08 NOTE — Progress Notes (Signed)
Occupational Therapy Treatment Patient Details Name: Oscar Sanchez MRN: [id number] DOB: 09-17-1945 Today's Date: 03/08/2019    History of present illness Pt is a 73 y.o. male presenting to hospital 03/04/19 with back pain, nausea, weakness, and difficulty with PEG tube feedings.  Pt admitted with acute kidney injury, hypernatremia, sinus tachycardia, and acute on chronic back pain.  PMH includes esophageal diverticulum and achalasia s/p PEG tube, pulmonary emphysema, dysphagia, COPD, c-spine surgery.   OT comments  Pt seen for OT tx this date to f/u re: tolerance and safety with self care ADLs and ADL mobility. Pt demos ability to transition to EOB with MOD I. Demos G static sitting balance and G-/F+ dynamic sitting to reach outside BOS to retrieve ADL items (shower cap, towel, comb, oral rinse). Pt tolerates sitting upright x14 mins, demonstrating improving sitting fxl activity tolerance. OT upgraded d/c dispo to [facility] and intermittent supervision to ensure pt safety.    Follow Up Recommendations  Home health OT;Supervision - Intermittent    Equipment Recommendations  3 in 1 bedside commode    Recommendations for Other Services      Precautions / Restrictions Precautions Precautions: Fall Precaution Comments: NPO; PEG tube, ice chips allowed Restrictions Weight Bearing Restrictions: No       Mobility Bed Mobility Overal bed mobility: Modified Independent;Needs Assistance Bed Mobility: Supine to Sit;Sit to Supine     Supine to sit: Modified independent (Device/Increase time) Sit to supine: Modified independent (Device/Increase time)   General bed mobility comments: assist to manage tube feeding and IV lines, requires increased time  Transfers                 General transfer comment: deferred on tx    Balance Overall balance assessment: Needs assistance Sitting-balance support: Feet supported Sitting balance-Leahy Scale: Good Sitting balance - Comments: Pt demos  G sitting balance with kyphotic posture, able to correct to more extended posture when reminded intermittently. Tolerates EOB sitting x14 minutes to complete UB self care tasks at setup-MIN A level. Demos improving fxl activity tolerance       Standing balance comment: deferred standing on this tx as pt fatigued and walked with PT this date.                           ADL either performed or assessed with clinical judgement   ADL Overall ADL's : Needs assistance/impaired     Grooming: Wash/dry face;Oral care;Applying deodorant;Brushing hair;Set up;Sitting Grooming Details (indicate cue type and reason): seated EOB Upper Body Bathing: Minimal assistance;Sitting Upper Body Bathing Details (indicate cue type and reason): seated EOB to was and dry hair and UB                                 Vision Patient Visual Report: No change from baseline     Perception     Praxis      Cognition Arousal/Alertness: Awake/alert Behavior During Therapy: WFL for tasks assessed/performed Overall Cognitive Status: Within Functional Limits for tasks assessed Area of Impairment: Safety/judgement                         Safety/Judgement: Decreased awareness of safety;Decreased awareness of deficits     General Comments: Pt A&O x4, much more appropriate this date.        Exercises Other Exercises Other Exercises: OT facilitates  education with pt re: importance of OOB activity. Pt verbalized understanding and more agreeable this date to fxl activity tolerance challenge.   Shoulder Instructions       General Comments      Pertinent Vitals/ Pain       Pain Assessment: 0-10 Pain Score: 6  Pain Location: back/cervical spine area Pain Descriptors / Indicators: Aching Pain Intervention(s): Monitored during session;Repositioned  Home Living                                          Prior Functioning/Environment              Frequency   Min 2X/week        Progress Toward Goals  OT Goals(current goals can now be found in the care plan section)  Progress towards OT goals: Progressing toward goals  Acute Rehab OT Goals Patient Stated Goal: to improve pain and mobility OT Goal Formulation: With patient Time For Goal Achievement: 03/19/19 Potential to Achieve Goals: Good  Plan Discharge plan needs to be updated    Co-evaluation                 AM-PAC OT "6 Clicks" Daily Activity     Outcome Measure   Help from another person eating meals?: None Help from another person taking care of personal grooming?: None Help from another person toileting, which includes using toliet, bedpan, or urinal?: A Little Help from another person bathing (including washing, rinsing, drying)?: A Little Help from another person to put on and taking off regular upper body clothing?: A Little Help from another person to put on and taking off regular lower body clothing?: A Little 6 Click Score: 20    End of Session    OT Visit Diagnosis: Unsteadiness on feet (R26.81);Muscle weakness (generalized) (M62.81)   Activity Tolerance Patient tolerated treatment well   Patient Left in bed;with call bell/phone within reach;with bed alarm set   Nurse Communication          Time: [id number] OT Time Calculation (min): 28 min  Charges: OT General Charges $OT Visit: 1 Visit OT Treatments $Self Care/Home Management : 23-37 mins  Gerrianne Scale, MS, OTR/L ascom [phone] 03/08/19, 3:50 PM

## 2019-03-09 LAB — GLUCOSE, CAPILLARY
Glucose-Capillary: 132 mg/dL — ABNORMAL HIGH (ref 70–99)
Glucose-Capillary: 136 mg/dL — ABNORMAL HIGH (ref 70–99)
Glucose-Capillary: 141 mg/dL — ABNORMAL HIGH (ref 70–99)
Glucose-Capillary: 150 mg/dL — ABNORMAL HIGH (ref 70–99)
Glucose-Capillary: 164 mg/dL — ABNORMAL HIGH (ref 70–99)
Glucose-Capillary: 172 mg/dL — ABNORMAL HIGH (ref 70–99)

## 2019-03-09 LAB — CULTURE, BLOOD (ROUTINE X 2)
Culture: NO GROWTH
Culture: NO GROWTH
Special Requests: ADEQUATE

## 2019-03-09 LAB — AFP TUMOR MARKER: AFP, Serum, Tumor Marker: 1 ng/mL (ref 0.0–8.3)

## 2019-03-09 LAB — PTH-RELATED PEPTIDE: PTH-related peptide: <2 pmol/L

## 2019-03-09 LAB — CEA: CEA: 8.6 ng/mL — ABNORMAL HIGH (ref 0.0–4.7)

## 2019-03-09 LAB — CANCER ANTIGEN 19-9: CA 19-9: 11 U/mL (ref 0–35)

## 2019-03-09 MED ORDER — PANTOPRAZOLE SODIUM 40 MG PO PACK
40.0000 mg | PACK | Freq: Every day | ORAL | Status: DC
  Administered 2019-03-10 – 2019-03-13 (×4): 40 mg
  Filled 2019-03-09 (×5): qty 20

## 2019-03-09 NOTE — Progress Notes (Signed)
PROGRESS NOTE    Oscar Sanchez  [id number] DOB: 1945/12/26 DOA: 03/04/2019  PCP: Wardell Honour, MD    LOS - 4   Brief Narrative:  73 y.o.malewith a known history of esophageal diverticulum and achalasia status post PEG tube,admitted11/29/20for acute on chronicback pain,unable to eatand nausea.Patient had been unable to tolerate PEG tube feeds for several days prior to admission, due to intractable nausea. Palliative care consulted. Patient not surgical candidate to address esophageal issues unless able to gain 30 pounds.   Subjective 12/4: Patient awake sitting up in bed.  Reports pain well controlled.  States tolerating tube feeds without pain, nausea or vomiting.  States he wants to go home with hospice as long as he can be kept comfortable.  No acute events reported.   Assessment & Plan:   Active Problems:   Acute kidney injury (HCC)   Protein-calorie malnutrition, severe   Severe malnutrition (HCC)   Hypernatremia   Hypercalcemia   Achalasia   Hypokalemia   Malignant neoplasm metastatic to liver [facility])   Retroperitoneal mass   Liver lesion, left lobe   Metastatic squamous cell carcinoma, unknown primary, with liver metastases and retroperitoneal adenopathy  - IR did U/S-guided liver lesion biopsy which showed metastatic squamous cell carcinoma - primary likely esophageal based on clinical history - further biopsy studies pending - oncology consulted - palliative care consulted - unlikely candidate for systemic chemo given poor functional and nutritional status - palliative vs hospice at home on discharge - fentanyl patch and Hycet liquid PRN  Acute kidney injurysecondary to dehydration - resolved with fluids Likely from poor p.o. intake - monitor renal function  Hypernatremia- resolved  Likely due to dehydration from poor p.o. intake.   Sinus tachycardia -Likely from severe back pain -We will order pain medicine and get x-ray of his  back,monitor his heart rate  Acute on chronic back pain Getthoracolumbarx-rays,consider MRI if pain continues or clinical worsening  Protein-calorie malnutrition, severe  - dietician consulted - high risk for refeeding syndrome -previously,at home he was to takeNutren 1.5 to six cans per day   Esophageal diverticulum/achalasia -followed at [facility] -goal of 30 pound weight gain to be a surgical candidate.  Iron deficiency anemia due to chronic blood loss -monitor blood counts  Chronic Constipation - lactulose as needed  Chronic Pain- suspect due to malignancy, primary to be determined. Started low-dose fentanyl patch and PRN Hycet liquid by tube.  Generalized Weakness - secondary to malnutrition and poor PO intake   DVT prophylaxis: Lovenox   Code Status: DNR  Family Communication: none at bedside  Disposition Plan:  Pending, expect he may need SNF or HH PT on discharge.  Expect medically ready in 1-2 days.   Consultants:   Palliative Care  Interventional Radiology  Oncology  Procedures:   U/S guided liver biospy 12/2  Antimicrobials:   None   Objective: Vitals:   03/08/19 0908 03/08/19 1600 03/09/19 0010 03/09/19 0500  BP: (!) 86/58 (!) 90/58 95/60   Pulse: 94 77 82   Resp: 16 18 17    Temp: 98.2 F (36.8 C) 98.4 F (36.9 C) 98.2 F (36.8 C)   TempSrc: Oral Oral Oral   SpO2: 98% 97% 96%   Weight:    45.2 kg  Height:        Intake/Output Summary (Last 24 hours) at 03/09/2019 1356 Last data filed at 03/09/2019 0300 Gross per 24 hour  Intake 2743.31 ml  Output 800 ml  Net 1943.31 ml  Filed Weights   03/07/19 0500 03/08/19 0409 03/09/19 0500  Weight: 46.3 kg 46.4 kg 45.2 kg    Examination:  General exam: awake, alert, no acute distress, frail, cachectic HEENT: dry mucus membranes, hearing grossly normal  Respiratory system: clear to auscultation bilaterally, no wheezes, rales or rhonchi, normal respiratory effort.  Cardiovascular system: normal S1/S2, RRR, no JVD, murmurs, rubs, gallops, no pedal edema.   Gastrointestinal system: soft, non-tender, non-distended abdomen, no organomegaly or masses felt, normal bowel sounds. Central nervous system: alert and oriented x4. no gross focal neurologic deficits, normal speech Extremities: moves all, no edema, normal tone Skin: dry, intact, normal temperature Psychiatry: normal mood, congruent affect, judgement and insight appear normal    Data Reviewed: I have personally reviewed following labs and imaging studies  CBC: Recent Labs  Lab 03/04/19 1412 03/05/19 0525 03/06/19 0345 03/08/19 0521  WBC 19.3* 13.7* 10.7* 9.3  HGB 16.4 13.0 11.7* 11.4*  HCT 50.2 40.5 37.1* 33.9*  MCV 85.2 86.5 88.8 82.5  PLT 276 195 158 [id number]   Basic Metabolic Panel: Recent Labs  Lab 03/04/19 1412 03/05/19 0525 03/06/19 0345 03/07/19 0345 03/08/19 0521  NA 152* 154* 146* 136 133*  K 3.6 3.4* 3.5 3.6 3.9  CL 105 114* 110 101 99  CO2 24 26 28 27 26   GLUCOSE 158* 123* 188* 171* 158*  BUN 56* 47* 30* 19 13  CREATININE 1.54* 1.14 1.01 0.82 0.74  CALCIUM 12.6* 11.0* 10.6* 9.3 9.4  MG  --   --  2.3  --  2.2  PHOS  --   --  2.0*  --  2.7   GFR: Estimated Creatinine Clearance: 52.6 mL/min (by C-G formula based on SCr of 0.74 mg/dL). Liver Function Tests: Recent Labs  Lab 03/05/19 0525  AST 17  ALT 14  ALKPHOS 68  BILITOT 1.0  PROT 7.7  ALBUMIN 3.3*   No results for input(s): LIPASE, AMYLASE in the last 168 hours. No results for input(s): AMMONIA in the last 168 hours. Coagulation Profile: Recent Labs  Lab 03/07/19 0345  INR 1.2   Cardiac Enzymes: No results for input(s): CKTOTAL, CKMB, CKMBINDEX, TROPONINI in the last 168 hours. BNP (last 3 results) No results for input(s): PROBNP in the last 8760 hours. HbA1C: No results for input(s): HGBA1C in the last 72 hours. CBG: Recent Labs  Lab 03/08/19 2018 03/09/19 0015 03/09/19 0500 03/09/19 1003  03/09/19 1202  GLUCAP 157* 150* 164* 136* 172*   Lipid Profile: No results for input(s): CHOL, HDL, LDLCALC, TRIG, CHOLHDL, LDLDIRECT in the last 72 hours. Thyroid Function Tests: No results for input(s): TSH, T4TOTAL, FREET4, T3FREE, THYROIDAB in the last 72 hours. Anemia Panel: No results for input(s): VITAMINB12, FOLATE, FERRITIN, TIBC, IRON, RETICCTPCT in the last 72 hours. Sepsis Labs: Recent Labs  Lab 03/04/19 1412 03/04/19 1515 03/04/19 1715 03/05/19 0525 03/06/19 0345  PROCALCITON <0.10  --   --  <0.10 <0.10  LATICACIDVEN  --  2.6* 2.6*  --   --     Recent Results (from the past 240 hour(s))  Culture, blood (routine x 2)     Status: None   Collection Time: 03/04/19  3:15 PM   Specimen: BLOOD  Result Value Ref Range Status   Specimen Description BLOOD RIGHT ANTECUBITAL  Final   Special Requests   Final    BOTTLES DRAWN AEROBIC AND ANAEROBIC Blood Culture results may not be optimal due to an inadequate volume of blood received in culture bottles   Culture  Final    NO GROWTH 5 DAYS Performed at [facility], [address] [place], [place] [address]    Report Status 03/09/2019 FINAL  Final  Culture, blood (routine x 2)     Status: None   Collection Time: 03/04/19  5:42 PM   Specimen: BLOOD  Result Value Ref Range Status   Specimen Description BLOOD FOREARM  Final   Special Requests   Final    BOTTLES DRAWN AEROBIC AND ANAEROBIC Blood Culture adequate volume   Culture   Final    NO GROWTH 5 DAYS Performed at [facility], [address] [place], [place] [address]    Report Status 03/09/2019 FINAL  Final  SARS CORONAVIRUS 2 (TAT 6-24 HRS) Nasopharyngeal Nasopharyngeal Swab     Status: None   Collection Time: 03/04/19  6:25 PM   Specimen: Nasopharyngeal Swab  Result Value Ref Range Status   SARS Coronavirus 2 NEGATIVE NEGATIVE Final    Comment: (NOTE) SARS-CoV-2 target nucleic acids are NOT DETECTED. The SARS-CoV-2 RNA is generally  detectable in upper and lower respiratory specimens during the acute phase of infection. Negative results do not preclude SARS-CoV-2 infection, do not rule out co-infections with other pathogens, and should not be used as the sole basis for treatment or other patient management decisions. Negative results must be combined with clinical observations, patient history, and epidemiological information. The expected result is Negative. Fact Sheet for Patients: SugarRoll.be Fact Sheet for Healthcare Providers: https://www.woods-mathews.com/ This test is not yet approved or cleared by the [place] FDA and  has been authorized for detection and/or diagnosis of SARS-CoV-2 by FDA under an Emergency Use Authorization (EUA). This EUA will remain  in effect (meaning this test can be used) for the duration of the COVID-19 declaration under Section 56 4(b)(1) of the Act, 21 U.S.C. section 360bbb-3(b)(1), unless the authorization is terminated or revoked sooner. Performed at [facility], [place] [address] [place], [place] [address]          Radiology Studies: No results found.      Scheduled Meds: . docusate  100 mg Per Tube BID  . enoxaparin (LOVENOX) injection  30 mg Subcutaneous Q24H  . fentaNYL  1 patch Transdermal Q72H  . free water  120 mL Per Tube Q4H  . pantoprazole sodium  40 mg Per Tube Daily  . tiotropium  18 mcg Inhalation Daily   Continuous Infusions: . feeding supplement (OSMOLITE 1.5 CAL) 1,000 mL (03/09/19 0949)     LOS: 4 days    Time spent: 30-35 minutes    Ezekiel Slocumb, DO [facility] Pager: [phone]  If 7PM-7AM, please contact night-coverage [email or web address] Password [email or web address] 03/09/2019, 1:56 PM

## 2019-03-09 NOTE — Progress Notes (Signed)
Daily Progress Note   Patient Name: Oscar Sanchez       Date: 03/09/2019 DOB: 01-28-1946  Age: 73 y.o. MRN#: [id number] Attending Physician: Ezekiel Slocumb, DO Primary Care Physician: Wardell Honour, MD Admit Date: 03/04/2019  Reason for Consultation/Follow-up: Establishing goals of care  Subjective: Patient sitting up in recliner staring out window. He denies pain or shortness of breath. He tearfully states "the one thing that is going right is my pain is much more controlled otherwise I am dying!" Emotional support provided. Space and opportunity allowed for patient to discuss and express his feelings.   He was able to provide a summary of updates and reports given by Oncology. He states he is hearing what is being said but still has faith that God will give him more time and give him a miracle. Support given. He understands his poor prognosis expressing he has a year and most likely less than that to live. Emotional support provided.   Oscar Sanchez states he has accepted the news and at this point his wishes are to get back home with hospice support. He states his goal is to be in as least pain as possible and take it one day at a time. I offered to discuss with his sister. Of note patient previously expressed wishes to contact Oscar Sanchez with information but now she is not to receive information and his sister, Oscar Sanchez is his only contact. He attempted to call sister with no answer. He states he will update her later but have already spoken with her and so has the Oncologist. He states she and her family is under quarantine for an exposure and pending results.   We discussed the care and goals of hospice. Oscar Sanchez verbalized understanding and again requested their services in his home. He states  once he is home and his sister is able to be with him she will come and support him. I explained my worries of him being alone in the future as his health declines. He verbalizes understanding and again expressed his sister will be with him once she is out of quarantine but feels he will be ok until then with other friend/family support. Oscar Sanchez states he wishes to continue with his tube feedings in the home until he is unable to tolerate.   I  discussed the referral and approval process for outpatient hospice. He verbalized understanding and appreciation.   Length of Stay: 4  Current Medications: Scheduled Meds:  . docusate  100 mg Per Tube BID  . enoxaparin (LOVENOX) injection  30 mg Subcutaneous Q24H  . fentaNYL  1 patch Transdermal Q72H  . free water  120 mL Per Tube Q4H  . pantoprazole sodium  40 mg Per Tube Daily  . tiotropium  18 mcg Inhalation Daily    Continuous Infusions: . feeding supplement (OSMOLITE 1.5 CAL) 1,000 mL (03/09/19 0949)    PRN Meds: acetaminophen **OR** acetaminophen, albuterol, bisacodyl, HYDROcodone-acetaminophen, morphine injection, ondansetron **OR** ondansetron (ZOFRAN) IV, traZODone  Physical Exam         -awake, alert, cachectic, chronically-ill appearing -RRR -diminished  -muscle wasting -A&O x3, mood appropriate  Vital Signs: BP 95/60 (BP Location: Right Arm)   Pulse 82   Temp 98.2 F (36.8 C) (Oral)   Resp 17   Ht 5\' 9"  (1.753 m)   Wt 45.2 kg   SpO2 96%   BMI 14.72 kg/m  SpO2: SpO2: 96 % O2 Device: O2 Device: Room Air O2 Flow Rate: O2 Flow Rate (L/min): 2 L/min  Intake/output summary:   Intake/Output Summary (Last 24 hours) at 03/09/2019 1225 Last data filed at 03/09/2019 0300 Gross per 24 hour  Intake 2743.31 ml  Output 1050 ml  Net 1693.31 ml   LBM: Last BM Date: (last bowel movement unknown) Baseline Weight: Weight: 48.5 kg Most recent weight: Weight: 45.2 kg       Palliative Assessment/Data: Tube feedings     Flowsheet Rows     Most Recent Value  Intake Tab  Referral Department  Hospitalist  Unit at Time of Referral  ER  Date Notified  03/04/19  Palliative Care Type  New Palliative care  Reason for referral  Clarify Goals of Care  Date of Admission  03/04/19  Date first seen by Palliative Care  03/08/19  # of days Palliative referral response time  4 Day(s)  # of days IP prior to Palliative referral  0  Clinical Assessment  Psychosocial & Spiritual Assessment  Palliative Care Outcomes      Patient Active Problem List   Diagnosis Date Noted  . Liver lesion, left lobe 03/07/2019  . Malignant neoplasm metastatic to liver ([place])   . Retroperitoneal mass   . Protein-calorie malnutrition, severe 03/05/2019  . Severe malnutrition ([place]) 03/05/2019  . Hypernatremia   . Hypercalcemia   . Achalasia   . Hypokalemia   . Acute kidney injury ([place]) 03/04/2019    Palliative Care Assessment & Plan   Recommendations/Plan:  DNR/DNI  Continue with current plan of care while hospitalized per medical team  Patient requesting outpatient hospice support with a goal of comfort/EOL. States his sister and her daughter will be his main support in addition to his pastor.   Hospice referral placed (patient should be able to continue with home tube feedings with awareness they may not be covered under hospice)  PMT will continue to support and follow as needed. No weekend coverage. Please page during the week with further needs.   Goals of Care and Additional Recommendations:  Limitations on Scope of Treatment: Continue to treat the treatable, no escalation of care  Code Status:    Code Status Orders  (From admission, onward)         Start     Ordered   03/05/19 0200  Do not attempt resuscitation (DNR)  Continuous  Question Answer Comment  In the event of cardiac or respiratory ARREST Do not call a "code blue"   In the event of cardiac or respiratory ARREST Do not perform Intubation, CPR,  defibrillation or ACLS   In the event of cardiac or respiratory ARREST Use medication by any route, position, wound care, and other measures to relive pain and suffering. May use oxygen, suction and manual treatment of airway obstruction as needed for comfort.      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   Discharge Planning:  Home with Hospice  Care plan was discussed with patient, RN, Santiago Glad, RN (hospice), Dr. Rogue Bussing, and Dr. Arbutus Ped.   Thank you for allowing the Palliative Medicine Team to assist in the care of this patient.  Total Time: 45 min.   Greater than 50%  of this time was spent counseling and coordinating care related to the above assessment and plan.  Alda Lea, AGPCNP-BC Palliative Medicine Team   Please contact Palliative Medicine Team phone at [phone] for questions and concerns.

## 2019-03-09 NOTE — Progress Notes (Signed)
Chaplain received an OR to complete or update an AD. Upon arrival, the patient was observed to be sitting up in the bed. Today, he appears to be feeling better and in less pain, as evidenced by no grimacing or wincing aloud. The patient confirmed this and reported that his pain is much-improved. He is grateful for this improvement. The patient shared that he received bad news today in diagnosis of cancer and that he may have about one year to live. The patient is understandably having difficulty with this news, but he continues to call on his faith as he seeks to process and integrate this into his existing understanding of a loving, powerful, and capable God. This chaplain provided support in the form of active and reflective listening, comfort and theological reflection.

## 2019-03-09 NOTE — Progress Notes (Signed)
Oscar Sanchez   DOB:Jun 27, 1945   [id number]    Subjective: Patient complains of continued abdominal pain.  Back pain.  Poor appetite. Objective:  Vitals:   03/10/19 0329 03/10/19 0754  BP: (!) 94/57 (!) 98/58  Pulse: 87 82  Resp: 16 18  Temp:  97.8 F (36.6 C)  SpO2: 96% 96%     Intake/Output Summary (Last 24 hours) at 03/10/2019 1312 Last data filed at 03/10/2019 1102 Gross per 24 hour  Intake -  Output 600 ml  Net -600 ml    Physical Exam  Constitutional: He is oriented to person, place, and time.  Cachectic appearing Caucasian male patient.  Resting in the bed comfortably.  HENT:  Head: Normocephalic and atraumatic.  Mouth/Throat: Oropharynx is clear and moist. No oropharyngeal exudate.  Eyes: Pupils are equal, round, and reactive to light.  Neck: Normal range of motion. Neck supple.  Cardiovascular: Normal rate and regular rhythm.  Pulmonary/Chest: No respiratory distress. He has no wheezes.  Decreased air entry bilaterally.  Abdominal: Soft. Bowel sounds are normal. He exhibits no distension and no mass. There is no abdominal tenderness. There is no rebound and no guarding.  PEG tube in place.  Musculoskeletal: Normal range of motion.        General: No tenderness or edema.  Neurological: He is alert and oriented to person, place, and time.  Skin: Skin is warm.  Psychiatric: Affect normal.     Labs:  Lab Results  Component Value Date   WBC 9.5 03/10/2019   HGB 10.5 (L) 03/10/2019   HCT 32.6 (L) 03/10/2019   MCV 85.6 03/10/2019   PLT 177 03/10/2019    Lab Results  Component Value Date   NA 133 (L) 03/08/2019   K 3.9 03/08/2019   CL 99 03/08/2019   CO2 26 03/08/2019    Studies:  No results found.  Liver lesion, left lobe #73 year old male patient with recent diagnosis of achalasia/weight loss-s/p PEG tube; is currently admitted to hospital for dehydration/acute renal failure; noted to have liver lesion bulky retroperitoneal adenopathy  #Liver  lesions/bulky retroperitoneal adenopathy-biopsy positive for metastatic squamous cell carcinoma.  Primary unclear-clinically quite possible esophagus.  Discussed with Dr.Spaetz-at [facility]; as per last EGD no evidence of malignancy.  Question occult in the esophagus diverticulum.  IHC unhelpful as per pathology-see discussion below  # Weight loss/poor p.o. intake-s/p PEG tube-achalasia/underlying possible malignancy-unfortunately portends poor prognosis/poor tolerance to therapy.   #DNR/DNI.  #Long discussion the patient-regarding the pathology; suggestive of stage IV cancer-unknown primary.  Discussed that this will need further work-up-like PET scan-and systemic therapy like chemotherapy/immunotherapy.  Understands treatment options unfortunately not going to cure his disease.  The median survival would be approximately 12 months or so.  After lengthy discussion patient declines any further therapy/work-up; states that he wants to go home-be comfortable/with the pain under control.   Also had a long discussion with patient sister-Oscar Sanchez 12/04 evening-regarding patient's thoughts of declining therapy; and they seem to be on board.  Discussed with palliative care/plan discharge home on hospice.   # 40 minutes face-to-face with the patient discussing the above plan of care; more than 50% of time spent on prognosis/ natural history; counseling and coordination.

## 2019-03-10 ENCOUNTER — Telehealth: Payer: Self-pay | Admitting: Internal Medicine

## 2019-03-10 DIAGNOSIS — C787 Secondary malignant neoplasm of liver and intrahepatic bile duct: Secondary | ICD-10-CM

## 2019-03-10 DIAGNOSIS — Z594 Lack of adequate food and safe drinking water: Secondary | ICD-10-CM

## 2019-03-10 DIAGNOSIS — C801 Malignant (primary) neoplasm, unspecified: Secondary | ICD-10-CM

## 2019-03-10 DIAGNOSIS — M545 Low back pain: Secondary | ICD-10-CM

## 2019-03-10 LAB — CREATININE, SERUM
Creatinine, Ser: 0.63 mg/dL (ref 0.61–1.24)
GFR calc Af Amer: >60 mL/min (ref >60)
GFR calc non Af Amer: >60 mL/min (ref >60)

## 2019-03-10 LAB — GLUCOSE, CAPILLARY
Glucose-Capillary: 160 mg/dL — ABNORMAL HIGH (ref 70–99)
Glucose-Capillary: 144 mg/dL — ABNORMAL HIGH (ref 70–99)
Glucose-Capillary: 190 mg/dL — ABNORMAL HIGH (ref 70–99)
Glucose-Capillary: 178 mg/dL — ABNORMAL HIGH (ref 70–99)
Glucose-Capillary: 152 mg/dL — ABNORMAL HIGH (ref 70–99)
Glucose-Capillary: 169 mg/dL — ABNORMAL HIGH (ref 70–99)

## 2019-03-10 LAB — CBC
HCT: 32.6 % — ABNORMAL LOW (ref 39.0–52.0)
Hemoglobin: 10.5 g/dL — ABNORMAL LOW (ref 13.0–17.0)
MCH: 27.6 pg (ref 26.0–34.0)
MCHC: 32.2 g/dL (ref 30.0–36.0)
MCV: 85.6 fL (ref 80.0–100.0)
Platelets: 177 10*3/uL (ref 150–400)
RBC: 3.81 MIL/uL — ABNORMAL LOW (ref 4.22–5.81)
RDW: 13.6 % (ref 11.5–15.5)
WBC: 9.5 10*3/uL (ref 4.0–10.5)
nRBC: 0 % (ref 0.0–0.2)

## 2019-03-10 NOTE — Telephone Encounter (Signed)
Please schedule follow-up with me in 2 weeks. NO labs.   #MD/ and Merrily Pew Dx: Metastatic cancer

## 2019-03-10 NOTE — Plan of Care (Signed)

## 2019-03-10 NOTE — Progress Notes (Signed)
Oscar Sanchez   DOB:08/19/1945   [id number]    Subjective: Complains of neck pain.  Also back pain.  Currently improved on pain medication.  Denies of any nausea vomiting abdominal pain.  Objective:  Vitals:   03/10/19 0329 03/10/19 0754  BP: (!) 94/57 (!) 98/58  Pulse: 87 82  Resp: 16 18  Temp:  97.8 F (36.6 C)  SpO2: 96% 96%     Intake/Output Summary (Last 24 hours) at 03/10/2019 1320 Last data filed at 03/10/2019 1102 Gross per 24 hour  Intake -  Output 600 ml  Net -600 ml    Physical Exam  Constitutional: He is oriented to person, place, and time.  Cachectic appearing Caucasian male patient resting in the bed.  HENT:  Head: Normocephalic and atraumatic.  Mouth/Throat: Oropharynx is clear and moist. No oropharyngeal exudate.  Eyes: Pupils are equal, round, and reactive to light.  Neck: Normal range of motion. Neck supple.  Cardiovascular: Normal rate and regular rhythm.  Pulmonary/Chest: No respiratory distress. He has no wheezes.  Bilateral decreased air entry.  Abdominal: Soft. Bowel sounds are normal. He exhibits no distension and no mass. There is no abdominal tenderness. There is no rebound and no guarding.  Positive for PEG tube.  Musculoskeletal: Normal range of motion.        General: No tenderness or edema.  Neurological: He is alert and oriented to person, place, and time.  Skin: Skin is warm.  Psychiatric: Affect normal.    Labs:  Lab Results  Component Value Date   WBC 9.5 03/10/2019   HGB 10.5 (L) 03/10/2019   HCT 32.6 (L) 03/10/2019   MCV 85.6 03/10/2019   PLT 177 03/10/2019    Lab Results  Component Value Date   NA 133 (L) 03/08/2019   K 3.9 03/08/2019   CL 99 03/08/2019   CO2 26 03/08/2019    Studies:  No results found.  Liver lesion, left lobe #72 year old male patient with recent diagnosis of achalasia/weight loss-s/p PEG tube; is currently admitted to hospital for dehydration/acute renal failure; noted to have liver lesion bulky  retroperitoneal adenopathy  #Liver lesions/bulky retroperitoneal adenopathy-biopsy positive for metastatic squamous cell carcinoma.  Primary unclear-clinically quite possible esophagus.  See below  # Weight loss/poor p.o. intake-s/p PEG tube-achalasia/underlying possible malignancy-unfortunately portends poor prognosis/poor tolerance to therapy.  As per palliative care-patient can go on hospice; however tube feeds coverage might be an issue.  Awaiting social work input.  Discussed with Dr. Arbutus Ped.  #DNR/DNI.  #Patient today again states that he is not interested in any further therapy for his metastatic cancer-he states "God will cure him; and if not he will still go to heaven".  Reiterates that does not want to pursue any therapies.  Would not recommend PET scan/will not order NGS.  Recommend follow-up in the cancer center in approximately 2 weeks/post discharge.    Cammie Sickle, MD 03/10/2019  1:20 PM

## 2019-03-10 NOTE — Progress Notes (Signed)
PROGRESS NOTE    Oscar Sanchez  [id number] DOB: 1945-04-06 DOA: 03/04/2019  PCP: Wardell Honour, MD    LOS - 5   Brief Narrative:  73 y.o.malewith a known history of esophageal diverticulum and achalasia status post PEG tube,admitted11/29/20for acute on chronicback pain,unable to eatand nausea.Patient had been unable to tolerate PEG tube feeds for several days prior to admission, due to intractable nausea. Palliative care consulted. Patient not surgical candidate to address esophageal issues unless able to gain 30 pounds.  Subjective 12/5: Patient awake sitting up in bed when seen and examined today.  He is wincing, reports significant left shoulder pain but states pain all over.  He denies other acute complaints today.  States tolerating increased rate of tube feeds which was started yesterday.  No acute events reported overnight.  Assessment & Plan:   Active Problems:   Acute kidney injury (HCC)   Protein-calorie malnutrition, severe   Severe malnutrition ([facility])   Hypernatremia   Hypercalcemia   Achalasia   Hypokalemia   Malignant neoplasm metastatic to liver [facility])   Retroperitoneal mass   Liver lesion, left lobe   Metastatic squamous cell carcinoma, unknown primary, with liver metastases and retroperitoneal adenopathy  IR did U/S-guided liver lesion biopsy which showed metastatic squamous cell carcinoma.  Primary unknown, possibly esophageal based on clinical history however several prior EGDs and biopsies have been negative.  Patient has decided against pursuing treatment or further evaluation of metastatic cancer. - oncology consulted - palliative care consulted - palliative vs hospice at home on discharge - fentanyl patch and Hycet liquid PRN  Acute kidney injurysecondary to dehydration - resolved with fluids Likely from poor p.o. intake - monitor renal function  Hypernatremia- resolved  Likely due to dehydration from poor p.o. intake.    Sinus tachycardia -Likely from severe back pain -We will order pain medicine and get x-ray of his back,monitor his heart rate  Acute on chronic back pain Getthoracolumbarx-rays,consider MRI if pain continues or clinical worsening  Protein-calorie malnutrition, severe  - dietician consulted - high risk for refeeding syndrome - previously,at home he was to takeNutren 1.5 to six cans per day  - now on continuous tube feed at 60 mils per hour, tolerating  Esophageal diverticulum/achalasia -followed at [facility] -goal of 30 pound weight gain to be a surgical candidate.  Iron deficiency anemia due to chronic blood loss -monitor blood counts  Chronic Constipation - lactulose as needed  Chronic Pain- suspect due to malignancy, primary to be determined. Started low-dose fentanyl patch and PRN Hycet liquid by tube.  Generalized Weakness - secondary to malnutrition and poor PO intake   DVT prophylaxis:Lovenox Code Status: DNR Family Communication:none at bedside Disposition Plan:Home with hospice once set up and tube feed supplies obtained   Consultants:  Palliative Care  Interventional Radiology  Oncology  Procedures:  U/S guided liver biospy 12/2  Antimicrobials:  None   Objective: Vitals:   03/10/19 0020 03/10/19 0329 03/10/19 0500 03/10/19 0754  BP: 94/61 (!) 94/57  (!) 98/58  Pulse: 89 87  82  Resp: 16 16  18   Temp: 98.6 F (37 C)   97.8 F (36.6 C)  TempSrc: Oral     SpO2: 96% 96%  96%  Weight:   45.4 kg   Height:        Intake/Output Summary (Last 24 hours) at 03/10/2019 1516 Last data filed at 03/10/2019 1102 Gross per 24 hour  Intake -  Output 600 ml  Net -600 ml  Filed Weights   03/08/19 0409 03/09/19 0500 03/10/19 0500  Weight: 46.4 kg 45.2 kg 45.4 kg    Examination:  General exam: awake, alert, mild distress due to pain, frail, cachectic Respiratory system: clear to auscultation bilaterally, no wheezes, rales  or rhonchi, normal respiratory effort. Cardiovascular system: normal S1/S2, RRR, no JVD, murmurs, rubs, gallops, no pedal edema.   Gastrointestinal system: soft, non-tender, non-distended abdomen Extremities: moves all, no edema, normal tone    Data Reviewed: I have personally reviewed following labs and imaging studies  CBC: Recent Labs  Lab 03/04/19 1412 03/05/19 0525 03/06/19 0345 03/08/19 0521 03/10/19 0905  WBC 19.3* 13.7* 10.7* 9.3 9.5  HGB 16.4 13.0 11.7* 11.4* 10.5*  HCT 50.2 40.5 37.1* 33.9* 32.6*  MCV 85.2 86.5 88.8 82.5 85.6  PLT 276 195 158 151 [id number]   Basic Metabolic Panel: Recent Labs  Lab 03/04/19 1412 03/05/19 0525 03/06/19 0345 03/07/19 0345 03/08/19 0521 03/10/19 0905  NA 152* 154* 146* 136 133*  --   K 3.6 3.4* 3.5 3.6 3.9  --   CL 105 114* 110 101 99  --   CO2 24 26 28 27 26   --   GLUCOSE 158* 123* 188* 171* 158*  --   BUN 56* 47* 30* 19 13  --   CREATININE 1.54* 1.14 1.01 0.82 0.74 0.63  CALCIUM 12.6* 11.0* 10.6* 9.3 9.4  --   MG  --   --  2.3  --  2.2  --   PHOS  --   --  2.0*  --  2.7  --    GFR: Estimated Creatinine Clearance: 52.8 mL/min (by C-G formula based on SCr of 0.63 mg/dL). Liver Function Tests: Recent Labs  Lab 03/05/19 0525  AST 17  ALT 14  ALKPHOS 68  BILITOT 1.0  PROT 7.7  ALBUMIN 3.3*   No results for input(s): LIPASE, AMYLASE in the last 168 hours. No results for input(s): AMMONIA in the last 168 hours. Coagulation Profile: Recent Labs  Lab 03/07/19 0345  INR 1.2   Cardiac Enzymes: No results for input(s): CKTOTAL, CKMB, CKMBINDEX, TROPONINI in the last 168 hours. BNP (last 3 results) No results for input(s): PROBNP in the last 8760 hours. HbA1C: No results for input(s): HGBA1C in the last 72 hours. CBG: Recent Labs  Lab 03/09/19 2035 03/10/19 0103 03/10/19 0423 03/10/19 0752 03/10/19 1206  GLUCAP 132* 160* 144* 190* 178*   Lipid Profile: No results for input(s): CHOL, HDL, LDLCALC, TRIG, CHOLHDL,  LDLDIRECT in the last 72 hours. Thyroid Function Tests: No results for input(s): TSH, T4TOTAL, FREET4, T3FREE, THYROIDAB in the last 72 hours. Anemia Panel: No results for input(s): VITAMINB12, FOLATE, FERRITIN, TIBC, IRON, RETICCTPCT in the last 72 hours. Sepsis Labs: Recent Labs  Lab 03/04/19 1412 03/04/19 1515 03/04/19 1715 03/05/19 0525 03/06/19 0345  PROCALCITON <0.10  --   --  <0.10 <0.10  LATICACIDVEN  --  2.6* 2.6*  --   --     Recent Results (from the past 240 hour(s))  Culture, blood (routine x 2)     Status: None   Collection Time: 03/04/19  3:15 PM   Specimen: BLOOD  Result Value Ref Range Status   Specimen Description BLOOD RIGHT ANTECUBITAL  Final   Special Requests   Final    BOTTLES DRAWN AEROBIC AND ANAEROBIC Blood Culture results may not be optimal due to an inadequate volume of blood received in culture bottles   Culture   Final  NO GROWTH 5 DAYS Performed at [facility], [address] [place], [place] [address]    Report Status 03/09/2019 FINAL  Final  Culture, blood (routine x 2)     Status: None   Collection Time: 03/04/19  5:42 PM   Specimen: BLOOD  Result Value Ref Range Status   Specimen Description BLOOD FOREARM  Final   Special Requests   Final    BOTTLES DRAWN AEROBIC AND ANAEROBIC Blood Culture adequate volume   Culture   Final    NO GROWTH 5 DAYS Performed at [facility], [address] [place], [place] [address]    Report Status 03/09/2019 FINAL  Final  SARS CORONAVIRUS 2 (TAT 6-24 HRS) Nasopharyngeal Nasopharyngeal Swab     Status: None   Collection Time: 03/04/19  6:25 PM   Specimen: Nasopharyngeal Swab  Result Value Ref Range Status   SARS Coronavirus 2 NEGATIVE NEGATIVE Final    Comment: (NOTE) SARS-CoV-2 target nucleic acids are NOT DETECTED. The SARS-CoV-2 RNA is generally detectable in upper and lower respiratory specimens during the acute phase of infection. Negative results do not preclude SARS-CoV-2  infection, do not rule out co-infections with other pathogens, and should not be used as the sole basis for treatment or other patient management decisions. Negative results must be combined with clinical observations, patient history, and epidemiological information. The expected result is Negative. Fact Sheet for Patients: SugarRoll.be Fact Sheet for Healthcare Providers: https://www.woods-mathews.com/ This test is not yet approved or cleared by the [place] FDA and  has been authorized for detection and/or diagnosis of SARS-CoV-2 by FDA under an Emergency Use Authorization (EUA). This EUA will remain  in effect (meaning this test can be used) for the duration of the COVID-19 declaration under Section 56 4(b)(1) of the Act, 21 U.S.C. section 360bbb-3(b)(1), unless the authorization is terminated or revoked sooner. Performed at [facility], [place] [address], [place], [place] [address]          Radiology Studies: No results found.      Scheduled Meds: . docusate  100 mg Per Tube BID  . enoxaparin (LOVENOX) injection  30 mg Subcutaneous Q24H  . fentaNYL  1 patch Transdermal Q72H  . free water  120 mL Per Tube Q4H  . pantoprazole sodium  40 mg Per Tube Daily  . tiotropium  18 mcg Inhalation Daily   Continuous Infusions: . feeding supplement (OSMOLITE 1.5 CAL) 1,000 mL (03/10/19 0337)     LOS: 5 days    Time spent: 15 to 20 minutes    Ezekiel Slocumb, DO [facility] Pager: [phone]  If 7PM-7AM, please contact night-coverage [email or web address] Password [email or web address] 03/10/2019, 3:16 PM

## 2019-03-11 LAB — GLUCOSE, CAPILLARY
Glucose-Capillary: 161 mg/dL — ABNORMAL HIGH (ref 70–99)
Glucose-Capillary: 163 mg/dL — ABNORMAL HIGH (ref 70–99)
Glucose-Capillary: 179 mg/dL — ABNORMAL HIGH (ref 70–99)
Glucose-Capillary: 184 mg/dL — ABNORMAL HIGH (ref 70–99)

## 2019-03-11 NOTE — Plan of Care (Signed)
  Problem: Education: °Goal: Knowledge of General Education information will improve °Description: Including pain rating scale, medication(s)/side effects and non-pharmacologic comfort measures °Outcome: Progressing °  °Problem: Coping: °Goal: Level of anxiety will decrease °Outcome: Progressing °  °Problem: Elimination: °Goal: Will not experience complications related to bowel motility °Outcome: Progressing °  °Problem: Pain Managment: °Goal: General experience of comfort will improve °Outcome: Progressing °  °Problem: Skin Integrity: °Goal: Risk for impaired skin integrity will decrease °Outcome: Progressing °  °

## 2019-03-11 NOTE — Progress Notes (Signed)
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 Palliative care consulted. Patient not surgical candidate to address esophageal issues unless able to gain 30 pounds.Ultrasound guided biopsy of liver lesion revealed squamous cell carcinoma, primary malignancy is unknown.  Oncology consulted and met with patient, discussed overall poor prognosis and potential treatment options.  Patient has elected not to purse further work up to determine primary source as he is not planning to have any treatment.  Patient will eventually return home with hospice vs palliative care, once tube feed regimen is determined and services/equipment arranged for home.  Subjective 12/6: Patient awake in bed.  States was having significant pain earlier, but medicine helped.  Tolerates tube feeds.  No other acute complaints.  Assessment & Plan:   Active Problems:   Acute kidney injury (HCC)   Protein-calorie malnutrition, severe   Severe malnutrition (HCC)   Hypernatremia   Hypercalcemia   Achalasia   Hypokalemia   Malignant neoplasm metastatic to liver [facility])   Retroperitoneal mass   Liver lesion, left lobe   Metastaticsquamous cell carcinoma, unknown primary,with liver metastasesand retroperitoneal adenopathy  IR did U/S-guided liver lesion biopsy which showed metastatic squamous cell carcinoma.  Primary unknown, possibly esophageal based on clinical history however several prior EGDs and biopsies have been negative.  Patient has decided against pursuing treatment or further evaluation of metastatic cancer. - oncology consulted -  palliative care consulted - palliative vs hospice at home on discharge - fentanyl patch and Hycet liquid PRN  Acute kidney injurysecondary to dehydration - resolved with fluids Likely from poor p.o. intake - monitor renal function  Hypernatremia- resolved  Likely due to dehydration from poor p.o. intake.   Sinus tachycardia -Likely from severe back pain -We will order pain medicine and get x-ray of his back,monitor his heart rate  Acute on chronic back pain Getthoracolumbarx-rays,consider MRI if pain continues or clinical worsening  Protein-calorie malnutrition, severe  - dietician consulted - high risk for refeeding syndrome - previously,at home he was to takeNutren 1.5 to six cans per day  - now on continuous tube feed at 60 mils per hour, tolerating  Esophageal diverticulum/achalasia -followed at [facility] -goal of 30 pound weight gain to be a surgical candidate.  Iron deficiency anemia due to chronic blood loss -monitor blood counts  Chronic Constipation - lactulose as needed  Chronic Pain- suspect due to malignancy, primary to be determined. Started low-dose fentanyl patchand PRN Hycet liquid by tube.  Generalized Weakness - secondary to malnutrition and poor PO intake   DVT prophylaxis:Lovenox Code Status: DNR Family Communication:none at bedside Disposition Plan:Home with hospice once set up and tube feed supplies obtained   Consultants:  Palliative Care  Interventional Radiology  Oncology  Procedures:  U/S guided liver biospy 12/2  Antimicrobials:  None   Objective: Vitals:   03/10/19 0754 03/10/19 1817 03/11/19 0004 03/11/19 0818  BP: (!) 98/58 (!) 95/56 (!) 97/58 (!) 106/59  Pulse: 82 90 89 88  Resp: '18 18 16 18  ' Temp: 97.8 F (36.6 C) 98.6 F (37 C) 98.9 F (37.2 C) 98.6 F (37 C)  TempSrc:  Oral Oral Oral  SpO2: 96% 98% 93% 95%  Weight:      Height:        Intake/Output Summary (Last 24 hours)  at 03/11/2019 8088 Last data filed at 03/11/2019 0600 Gross per 24 hour  Intake 3619 ml  Output 550 ml  Net 3069 ml   Filed Weights   03/08/19 0409 03/09/19 0500 03/10/19 0500  Weight: 46.4 kg 45.2 kg 45.4 kg    Examination:  General exam: awake, alert, no acute distress, cachectic Respiratory system: clear to auscultation bilaterally, no wheezes, rales or rhonchi, normal respiratory effort. Cardiovascular system: normal S1/S2, RRR, no JVD, murmurs, rubs, gallops, no pedal edema.   Gastrointestinal system: soft, non-tender, non-distended abdomen, PEG tube in place Extremities: moves all, no edema, normal tone    Data Reviewed: I have personally reviewed following labs and imaging studies  CBC: Recent Labs  Lab 03/04/19 1412 03/05/19 0525 03/06/19 0345 03/08/19 0521 03/10/19 0905  WBC 19.3* 13.7* 10.7* 9.3 9.5  HGB 16.4 13.0 11.7* 11.4* 10.5*  HCT 50.2 40.5 37.1* 33.9* 32.6*  MCV 85.2 86.5 88.8 82.5 85.6  PLT 276 195 158 151 110   Basic Metabolic Panel: Recent Labs  Lab 03/04/19 1412 03/05/19 0525 03/06/19 0345 03/07/19 0345 03/08/19 0521 03/10/19 0905  NA 152* 154* 146* 136 133*  --   K 3.6 3.4* 3.5 3.6 3.9  --   CL 105 114* 110 101 99  --   CO2 '24 26 28 27 26  ' --   GLUCOSE 158* 123* 188* 171* 158*  --   BUN 56* 47* 30* 19 13  --   CREATININE 1.54* 1.14 1.01 0.82 0.74 0.63  CALCIUM 12.6* 11.0* 10.6* 9.3 9.4  --   MG  --   --  2.3  --  2.2  --   PHOS  --   --  2.0*  --  2.7  --    GFR: Estimated Creatinine Clearance: 52.8 mL/min (by C-G formula based on SCr of 0.63 mg/dL). Liver Function Tests: Recent Labs  Lab 03/05/19 0525  AST 17  ALT 14  ALKPHOS 68  BILITOT 1.0  PROT 7.7  ALBUMIN 3.3*   No results for input(s): LIPASE, AMYLASE in the last 168 hours. No results for input(s): AMMONIA in the last 168 hours. Coagulation Profile: Recent Labs  Lab 03/07/19 0345  INR 1.2   Cardiac Enzymes: No results for input(s): CKTOTAL, CKMB, CKMBINDEX,  TROPONINI in the last 168 hours. BNP (last 3 results) No results for input(s): PROBNP in the last 8760 hours. HbA1C: No results for input(s): HGBA1C in the last 72 hours. CBG: Recent Labs  Lab 03/10/19 1206 03/10/19 1814 03/10/19 2031 03/11/19 0004 03/11/19 0425  GLUCAP 178* 152* 169* 163* 184*   Lipid Profile: No results for input(s): CHOL, HDL, LDLCALC, TRIG, CHOLHDL, LDLDIRECT in the last 72 hours. Thyroid Function Tests: No results for input(s): TSH, T4TOTAL, FREET4, T3FREE, THYROIDAB in the last 72 hours. Anemia Panel: No results for input(s): VITAMINB12, FOLATE, FERRITIN, TIBC, IRON, RETICCTPCT in the last 72 hours. Sepsis Labs: Recent Labs  Lab 03/04/19 1412 03/04/19 1515 03/04/19 1715 03/05/19 0525 03/06/19 0345  PROCALCITON <0.10  --   --  <0.10 <0.10  LATICACIDVEN  --  2.6* 2.6*  --   --     Recent Results (from the past 240 hour(s))  Culture, blood (routine x 2)     Status: None   Collection Time: 03/04/19  3:15 PM   Specimen: BLOOD  Result Value Ref  Range Status   Specimen Description BLOOD RIGHT ANTECUBITAL  Final   Special Requests   Final    BOTTLES DRAWN AEROBIC AND ANAEROBIC Blood Culture results may not be optimal due to an inadequate volume of blood received in culture bottles   Culture   Final    NO GROWTH 5 DAYS Performed at [facility], [address], [place], [place] [address]    Report Status 03/09/2019 FINAL  Final  Culture, blood (routine x 2)     Status: None   Collection Time: 03/04/19  5:42 PM   Specimen: BLOOD  Result Value Ref Range Status   Specimen Description BLOOD FOREARM  Final   Special Requests   Final    BOTTLES DRAWN AEROBIC AND ANAEROBIC Blood Culture adequate volume   Culture   Final    NO GROWTH 5 DAYS Performed at [facility], [address], [place], [place] [address]    Report Status 03/09/2019 FINAL  Final  SARS CORONAVIRUS 2 (TAT 6-24 HRS) Nasopharyngeal Nasopharyngeal Swab     Status:  None   Collection Time: 03/04/19  6:25 PM   Specimen: Nasopharyngeal Swab  Result Value Ref Range Status   SARS Coronavirus 2 NEGATIVE NEGATIVE Final    Comment: (NOTE) SARS-CoV-2 target nucleic acids are NOT DETECTED. The SARS-CoV-2 RNA is generally detectable in upper and lower respiratory specimens during the acute phase of infection. Negative results do not preclude SARS-CoV-2 infection, do not rule out co-infections with other pathogens, and should not be used as the sole basis for treatment or other patient management decisions. Negative results must be combined with clinical observations, patient history, and epidemiological information. The expected result is Negative. Fact Sheet for Patients: SugarRoll.be Fact Sheet for Healthcare Providers: https://www.woods-mathews.com/ This test is not yet approved or cleared by the [place] FDA and  has been authorized for detection and/or diagnosis of SARS-CoV-2 by FDA under an Emergency Use Authorization (EUA). This EUA will remain  in effect (meaning this test can be used) for the duration of the COVID-19 declaration under Section 56 4(b)(1) of the Act, 21 U.S.C. section 360bbb-3(b)(1), unless the authorization is terminated or revoked sooner. Performed at [facility], [place] [address] [place], [place] [address]          Radiology Studies: No results found.      Scheduled Meds: . docusate  100 mg Per Tube BID  . enoxaparin (LOVENOX) injection  30 mg Subcutaneous Q24H  . fentaNYL  1 patch Transdermal Q72H  . free water  120 mL Per Tube Q4H  . pantoprazole sodium  40 mg Per Tube Daily  . tiotropium  18 mcg Inhalation Daily   Continuous Infusions: . feeding supplement (OSMOLITE 1.5 CAL) 60 mL/hr at 03/11/19 0600     LOS: 6 days    Time spent: 15-20 minutes    Ezekiel Slocumb, DO [facility] Pager: [phone]  If 7PM-7AM, please contact  night-coverage [email or web address] Password [email or web address] 03/11/2019, 8:22 AM

## 2019-03-12 NOTE — Progress Notes (Signed)
Occupational Therapy Treatment Patient Details Name: Oscar Sanchez MRN: [id number] DOB: [birth year] Today's Date: 03/12/2019    History of present illness Pt is a 73 y.o. male presenting to hospital 03/04/19 with back pain, nausea, weakness, and difficulty with PEG tube feedings.  Pt admitted with acute kidney injury, hypernatremia, sinus tachycardia, and acute on chronic back pain.  PMH includes esophageal diverticulum and achalasia s/p PEG tube, pulmonary emphysema, dysphagia, COPD, c-spine surgery.   OT comments  Pt. education was provided about A/E use for LE ADLs for energy conservation, positioning for comfort, and work simplification strategies. Pt. Is limited by 8/10  Left sided cervical pain radiating through the left shoulder, back, and LLE. Pt. continues to benefit from OT services for ADL training, A/E training, and pt./caregiver education work simplification, positioning, home modification, and DME. Pt. Could benefit from follow-up [place] services upon discharge to home.   Follow Up Recommendations  Home health OT; Home care aide.    Equipment Recommendations  3 in 1 bedside commode    Recommendations for Other Services      Precautions / Restrictions Precautions Precautions: Fall Precaution Comments: NPO; PEG tube, ice chips allowed Restrictions Weight Bearing Restrictions: No       Mobility Bed Mobility Overal bed mobility: Modified Independent Bed Mobility: Supine to Sit     Supine to sit: Modified independent (Device/Increase time)        Transfers Overall transfer level: Needs assistance               General transfer comment: Deferred secondary to pain    Balance                                           ADL either performed or assessed with clinical judgement   ADL Overall ADL's : Needs assistance/impaired Eating/Feeding: NPO Eating/Feeding Details (indicate cue type and reason): setup to wet mouth with ice  chips Grooming: Set up;Independent;Bed level   Upper Body Bathing: Minimal assistance;Set up;Bed level   Lower Body Bathing: Moderate assistance;Bed level   Upper Body Dressing : Minimal assistance;Bed level   Lower Body Dressing: Moderate assistance;Bed level                       Vision Patient Visual Report: No change from baseline     Perception     Praxis      Cognition Arousal/Alertness: Awake/alert Behavior During Therapy: WFL for tasks assessed/performed Overall Cognitive Status: Within Functional Limits for tasks assessed Area of Impairment: Safety/judgement                         Safety/Judgement: Decreased awareness of safety;Decreased awareness of deficits     General Comments: Pt A&O x4, much more appropriate this date.        Exercises     Shoulder Instructions       General Comments      Pertinent Vitals/ Pain       Pain Assessment: 0-10 Pain Score: 8  Pain Location: left side cervical radiating through shoulder, back, and LE. Pain Descriptors / Indicators: [place]  Prior Functioning/Environment              Frequency  Min 2X/week        Progress Toward Goals  OT Goals(current goals can now be found in the care plan section)  Progress towards OT goals: OT to reassess next treatment  Acute Rehab OT Goals Patient Stated Goal: to improve pain and independence OT Goal Formulation: With patient Time For Goal Achievement: 03/19/19 Potential to Achieve Goals: Good  Plan Discharge plan remains appropriate    Co-evaluation                 AM-PAC OT "6 Clicks" Daily Activity     Outcome Measure   Help from another person eating meals?: None Help from another person taking care of personal grooming?: None Help from another person toileting, which includes using toliet, bedpan, or urinal?: A Little Help from another person bathing  (including washing, rinsing, drying)?: A Lot Help from another person to put on and taking off regular upper body clothing?: A Little Help from another person to put on and taking off regular lower body clothing?: A Lot 6 Click Score: 18    End of Session Equipment Utilized During Treatment: Gait belt;Rolling walker  OT Visit Diagnosis: Unsteadiness on feet (R26.81);Muscle weakness (generalized) (M62.81)   Activity Tolerance Patient limited by pain   Patient Left in bed;with call bell/phone within reach;with bed alarm set   Nurse Communication          Time: [id number] OT Time Calculation (min): 15 min  Charges: OT General Charges $OT Visit: 1 Visit OT Treatments $Self Care/Home Management : 8-22 mins  Harrel Carina, MS, OTR/L   Harrel Carina 03/12/2019, 3:34 PM

## 2019-03-12 NOTE — Progress Notes (Signed)
Physical Therapy Treatment Patient Details Name: Oscar Sanchez MRN: [id number] DOB: September 03, 1945 Today's Date: 03/12/2019    History of Present Illness Pt is a 73 y.o. male presenting to hospital 03/04/19 with back pain, nausea, weakness, and difficulty with PEG tube feedings.  Pt admitted with acute kidney injury, hypernatremia, sinus tachycardia, and acute on chronic back pain.  PMH includes esophageal diverticulum and achalasia s/p PEG tube, pulmonary emphysema, dysphagia, COPD, c-spine surgery.    PT Comments    Pt resting in bed upon PT arrival and pt reporting last time he walked in hallway was with therapy.  Pt recently received pain medication and pain mid/low back 4/10 beginning of session and 3/10 end of session.  Pt modified independent semi-supine to sitting edge of bed; CGA to SBA with transfers; and CGA to SBA walking 2 laps around nursing station/loop with walker.  Overall pt steady and safe ambulating with walker.  Pt requesting to use toilet end of session to try to have bowel movement but after attempting, pt reporting unable at this time so pt assisted with ambulating to recliner.  Will continue to focus on strengthening, balance, and progressive functional mobility during hospital stay.   Follow Up Recommendations  Home health PT     Equipment Recommendations  Rolling walker with 5" wheels;3in1 (PT)    Recommendations for Other Services OT consult     Precautions / Restrictions Precautions Precautions: Fall Precaution Comments: NPO; PEG tube, ice chips allowed Restrictions Weight Bearing Restrictions: No    Mobility  Bed Mobility Overal bed mobility: Modified Independent Bed Mobility: Supine to Sit     Supine to sit: Modified independent (Device/Increase time)     General bed mobility comments: no difficulties noted  Transfers Overall transfer level: Needs assistance Equipment used: Rolling walker (2 wheeled) Transfers: Sit to/from Stand Sit to Stand:  Min guard         General transfer comment: fairly strong stand up to walker (from bed and from toilet)  Ambulation/Gait Ambulation/Gait assistance: Min guard;Supervision Gait Distance (Feet): 350 Feet(plus 20 feet bathroom to chair) Assistive device: Rolling walker (2 wheeled) Gait Pattern/deviations: Step-through pattern Gait velocity: mildly decreased   General Gait Details: decreased BOS but steady with RW use   Stairs             Wheelchair Mobility    Modified Rankin (Stroke Patients Only)       Balance Overall balance assessment: Needs assistance Sitting-balance support: No upper extremity supported;Feet supported Sitting balance-Leahy Scale: Normal Sitting balance - Comments: steady sitting reaching outside BOS   Standing balance support: No upper extremity supported Standing balance-Leahy Scale: Good Standing balance comment: steady standing managing clothing for toileting                            Cognition Arousal/Alertness: Awake/alert Behavior During Therapy: WFL for tasks assessed/performed Overall Cognitive Status: Within Functional Limits for tasks assessed                                    Exercises      General Comments   Nursing cleared pt for participation in physical therapy.  Pt agreeable to PT session.      Pertinent Vitals/Pain Pain Assessment: 0-10 Pain Score: 8  Pain Location: left side cervical radiating through shoulder, back, and LE. Pain Descriptors / Indicators: Aching Pain Intervention(s):  Monitored during session;Limited activity within patient's tolerance;Patient requesting pain meds-RN notified;Premedicated before session  Vitals (HR and O2 on room air) stable and WFL throughout treatment session.    Home Living                      Prior Function            PT Goals (current goals can now be found in the care plan section) Acute Rehab PT Goals Patient Stated Goal: to  improve pain and independence PT Goal Formulation: With patient Time For Goal Achievement: 03/19/19 Potential to Achieve Goals: Fair Progress towards PT goals: Progressing toward goals    Frequency    Min 2X/week      PT Plan Current plan remains appropriate    Co-evaluation              AM-PAC PT "6 Clicks" Mobility   Outcome Measure  Help needed turning from your back to your side while in a flat bed without using bedrails?: None Help needed moving from lying on your back to sitting on the side of a flat bed without using bedrails?: None Help needed moving to and from a bed to a chair (including a wheelchair)?: A Little Help needed standing up from a chair using your arms (e.g., wheelchair or bedside chair)?: A Little Help needed to walk in hospital room?: A Little Help needed climbing 3-5 steps with a railing? : A Little 6 Click Score: 20    End of Session Equipment Utilized During Treatment: Gait belt(up high away from PEG tube) Activity Tolerance: Patient tolerated treatment well Patient left: in chair;with call bell/phone within reach;with chair alarm set Nurse Communication: Mobility status;Precautions;Other (comment)(NT notified pt requesting ice chips; nurse notified PEG tube feeding needing to be restarted) PT Visit Diagnosis: Other abnormalities of gait and mobility (R26.89);Muscle weakness (generalized) (M62.81);Unsteadiness on feet (R26.81);Difficulty in walking, not elsewhere classified (R26.2);Pain     Time: [id number] PT Time Calculation (min) (ACUTE ONLY): 24 min  Charges:  $Therapeutic Exercise: 8-22 mins $Therapeutic Activity: 8-22 mins                     Leitha Bleak, PT 03/12/19, 11:55 AM

## 2019-03-12 NOTE — Progress Notes (Signed)
PROGRESS NOTE    Oscar Sanchez  BTD:[id number] DOB: 07/26/1945 DOA: 03/04/2019  PCP: Wardell Honour, MD    LOS - 7   Brief Narrative:  73 y.o.malewith a known history of esophageal diverticulum and achalasia status post PEG tube,admitted11/29/20for acute on chronicback pain,unable to eatand nausea.Patient had been unable to tolerate PEG tube feeds for several days prior to admission, due to intractable nausea. Palliative care consulted. Patient not surgical candidate to address esophageal issues unless able to gain 30 pounds.Ultrasound guided biopsy of liver lesion revealed squamous cell carcinoma, primary malignancy is unknown.  Oncology consulted and met with patient, discussed overall poor prognosis and potential treatment options.  Patient has elected not to purse further work up to determine primary source as he is not planning to have any treatment.  Patient will eventually return home with hospice vs palliative care, once tube feed regimen is determined and services/equipment arranged for home.  Subjective 12/7: Patient seen.  No events overnight.  Reports pain currently under control.  No other complaints.  Assessment & Plan:   Active Problems:   Acute kidney injury (HCC)   Protein-calorie malnutrition, severe   Severe malnutrition (HCC)   Hypernatremia   Hypercalcemia   Achalasia   Hypokalemia   Malignant neoplasm metastatic to liver [facility])   Retroperitoneal mass   Liver lesion, left lobe   Metastaticsquamous cell carcinoma, unknown primary,with liver metastasesand retroperitoneal adenopathy  IR did U/S-guided liver lesion biopsy which showed metastatic squamous cell carcinoma. Primaryunknown, possiblyesophageal based on clinical historyhowever several prior EGDs and biopsies have been negative. Patient has decided against pursuing treatment or further evaluation of metastatic cancer. - oncology consulted - palliative care consulted - palliative vs  hospice at home on discharge - fentanyl patch and Hycet liquid PRN  Acute kidney injurysecondary to dehydration - resolved with fluids Likely from poor p.o. intake - monitor renal function  Hypernatremia- resolved  Likely due to dehydration from poor p.o. intake.   Sinus tachycardia -Likely from severe back pain -We will order pain medicine and get x-ray of his back,monitor his heart rate  Acute on chronic back pain Getthoracolumbarx-rays,consider MRI if pain continues or clinical worsening  Protein-calorie malnutrition, severe  - dietician consulted - high risk for refeeding syndrome - previously,at home he was to takeNutren 1.5 to six cans per day - now on continuous tube feed at 60 mils per hour, tolerating  Esophageal diverticulum/achalasia -followed at [place] -goal of 30 pound weight gain to be a surgical candidate.  Iron deficiency anemia due to chronic blood loss -monitor blood counts  Chronic Constipation - lactulose as needed  Chronic Pain- suspect due to malignancy, primary to be determined. Started low-dose fentanyl patchand PRN Hycet liquid by tube.  Generalized Weakness - secondary to malnutrition and poor PO intake   DVT prophylaxis:Lovenox Code Status: DNR Family Communication:none at bedside Disposition Plan:Home with hospice once set up and tube feed supplies obtained   Consultants:  Palliative Care  Interventional Radiology  Oncology  Procedures:  U/S guided liver biospy 12/2  Antimicrobials:  None   Objective: Vitals:   03/11/19 1723 03/11/19 2325 03/12/19 0621 03/12/19 0955  BP: 105/66 (!) 98/58 95/60 (!) 99/57  Pulse: 84 82 87 86  Resp: _0 Temp: 98 F (36.7 C) 97.6 F (36.4 C) 99.5 F (37.5 C) 98 F (36.7 C)  TempSrc:   Oral Oral  SpO2: 96% 96% 96% 94%  Weight:      Height:  Intake/Output Summary (Last 24 hours) at 03/12/2019 1401 Last data filed at 03/12/2019 0957  Gross per 24 hour  Intake 1219 ml  Output 800 ml  Net 419 ml   Filed Weights   03/08/19 0409 03/09/19 0500 03/10/19 0500  Weight: 46.4 kg 45.2 kg 45.4 kg    Examination:  General exam: awake, alert, no acute distress Respiratory system: normal respiratory effort, normal chest rise symmetric. Cardiovascular system: regular rate, normal rhythm Gastrointestinal system: soft, non-tender, non-distended abdomen  Data Reviewed: I have personally reviewed following labs and imaging studies  CBC: Recent Labs  Lab 03/06/19 0345 03/08/19 0521 03/10/19 0905  WBC 10.7* 9.3 9.5  HGB 11.7* 11.4* 10.5*  HCT 37.1* 33.9* 32.6*  MCV 88.8 82.5 85.6  PLT 158 151 431   Basic Metabolic Panel: Recent Labs  Lab 03/06/19 0345 03/07/19 0345 03/08/19 0521 03/10/19 0905  NA 146* 136 133*  --   K 3.5 3.6 3.9  --   CL 110 101 99  --   CO2 _0 --   GLUCOSE 188* 171* 158*  --   BUN 30* 19 13  --   CREATININE 1.01 0.82 0.74 0.63  CALCIUM 10.6* 9.3 9.4  --   MG 2.3  --  2.2  --   PHOS 2.0*  --  2.7  --    GFR: Estimated Creatinine Clearance: 52.8 mL/min (by C-G formula based on SCr of 0.63 mg/dL). Liver Function Tests: No results for input(s): AST, ALT, ALKPHOS, BILITOT, PROT, ALBUMIN in the last 168 hours. No results for input(s): LIPASE, AMYLASE in the last 168 hours. No results for input(s): AMMONIA in the last 168 hours. Coagulation Profile: Recent Labs  Lab 03/07/19 0345  INR 1.2   Cardiac Enzymes: No results for input(s): CKTOTAL, CKMB, CKMBINDEX, TROPONINI in the last 168 hours. BNP (last 3 results) No results for input(s): PROBNP in the last 8760 hours. HbA1C: No results for input(s): HGBA1C in the last 72 hours. CBG: Recent Labs  Lab 03/10/19 2031 03/11/19 0004 03/11/19 0425 03/11/19 0838 03/11/19 1143  GLUCAP 169* 163* 184* 179* 161*   Lipid Profile: No results for input(s): CHOL, HDL, LDLCALC, TRIG, CHOLHDL, LDLDIRECT in the last 72 hours. Thyroid  Function Tests: No results for input(s): TSH, T4TOTAL, FREET4, T3FREE, THYROIDAB in the last 72 hours. Anemia Panel: No results for input(s): VITAMINB12, FOLATE, FERRITIN, TIBC, IRON, RETICCTPCT in the last 72 hours. Sepsis Labs: Recent Labs  Lab 03/06/19 0345  PROCALCITON <0.10    Recent Results (from the past 240 hour(s))  Culture, blood (routine x 2)     Status: None   Collection Time: 03/04/19  3:15 PM   Specimen: BLOOD  Result Value Ref Range Status   Specimen Description BLOOD RIGHT ANTECUBITAL  Final   Special Requests   Final    BOTTLES DRAWN AEROBIC AND ANAEROBIC Blood Culture results may not be optimal due to an inadequate volume of blood received in culture bottles   Culture   Final    NO GROWTH 5 DAYS Performed at [facility], [address], [place], [place] [address]    Report Status 03/09/2019 FINAL  Final  Culture, blood (routine x 2)     Status: None   Collection Time: 03/04/19  5:42 PM   Specimen: BLOOD  Result Value Ref Range Status   Specimen Description BLOOD FOREARM  Final   Special Requests   Final    BOTTLES DRAWN AEROBIC AND ANAEROBIC Blood Culture adequate  volume   Culture   Final    NO GROWTH 5 DAYS Performed at [facility], [address] [place], [place] [address]    Report Status 03/09/2019 FINAL  Final  SARS CORONAVIRUS 2 (TAT 6-24 HRS) Nasopharyngeal Nasopharyngeal Swab     Status: None   Collection Time: 03/04/19  6:25 PM   Specimen: Nasopharyngeal Swab  Result Value Ref Range Status   SARS Coronavirus 2 NEGATIVE NEGATIVE Final    Comment: (NOTE) SARS-CoV-2 target nucleic acids are NOT DETECTED. The SARS-CoV-2 RNA is generally detectable in upper and lower respiratory specimens during the acute phase of infection. Negative results do not preclude SARS-CoV-2 infection, do not rule out co-infections with other pathogens, and should not be used as the sole basis for treatment or other patient management decisions.  Negative results must be combined with clinical observations, patient history, and epidemiological information. The expected result is Negative. Fact Sheet for Patients: SugarRoll.be Fact Sheet for Healthcare Providers: https://www.woods-mathews.com/ This test is not yet approved or cleared by the [place] FDA and  has been authorized for detection and/or diagnosis of SARS-CoV-2 by FDA under an Emergency Use Authorization (EUA). This EUA will remain  in effect (meaning this test can be used) for the duration of the COVID-19 declaration under Section 56 4(b)(1) of the Act, 21 U.S.C. section 360bbb-3(b)(1), unless the authorization is terminated or revoked sooner. Performed at [facility], [place] [address], [place], [place] [address]          Radiology Studies: No results found.      Scheduled Meds: . docusate  100 mg Per Tube BID  . enoxaparin (LOVENOX) injection  30 mg Subcutaneous Q24H  . fentaNYL  1 patch Transdermal Q72H  . free water  120 mL Per Tube Q4H  . pantoprazole sodium  40 mg Per Tube Daily  . tiotropium  18 mcg Inhalation Daily   Continuous Infusions: . feeding supplement (OSMOLITE 1.5 CAL) 1,000 mL (03/12/19 0815)     LOS: 7 days    Time spent: 15-20 minutes    Ezekiel Slocumb, DO [facility] Pager: [phone]  If 7PM-7AM, please contact night-coverage [email or web address] Password [email or web address] 03/12/2019, 2:01 PM

## 2019-03-12 NOTE — Care Management Important Message (Signed)
Important Message  Patient Details  Name: Oscar Sanchez MRN: [id number] Date of Birth: 09/14/1945   Medicare Important Message Given:  Yes     Juliann Pulse A [name] 03/12/2019, 12:01 PM

## 2019-03-12 NOTE — Progress Notes (Signed)
New referral for [facility] hospice services at home received from [facility]. Writer spoke in the room with patient to initiate education regarding hospice services, philosophy and team approach to care with understanding voiced. Patient will need a feeding pump in place in the home prior to discharge. Patient ahs declined any other DME at this time, he does have a walker at home. Writer spoke via telephone to patient's sister Shelbie Ammons regarding delivery of the feeding pump and to provide education regarding hospice services. She states she will be available tomorrow to receive the feeding pump. Plan is for patient to discharge home tomorrow via [place] after pump is in place. [place] updated. Patient inforamtion faxed to referral. Will continue to follow through discharge. Flo Shanks BSN, RN, [place] [phone]

## 2019-03-13 DIAGNOSIS — C787 Secondary malignant neoplasm of liver and intrahepatic bile duct: Secondary | ICD-10-CM

## 2019-03-13 DIAGNOSIS — C801 Malignant (primary) neoplasm, unspecified: Secondary | ICD-10-CM

## 2019-03-13 MED ORDER — HYDROCODONE-ACETAMINOPHEN 7.5-325 MG/15ML PO SOLN: 10.0000 mL | Freq: Four times a day (QID) | ORAL | 0 refills | Status: AC | PRN

## 2019-03-13 MED ORDER — FENTANYL 12 MCG/HR TD PT72: 1.0000 | MEDICATED_PATCH | TRANSDERMAL | 0 refills | Status: AC

## 2019-03-13 MED ORDER — FENTANYL 12 MCG/HR TD PT72: 1.0000 | MEDICATED_PATCH | TRANSDERMAL | 0 refills | Status: DC

## 2019-03-13 MED ORDER — FREE WATER: 120.0000 mL | Status: AC

## 2019-03-13 MED ORDER — PANTOPRAZOLE SODIUM 40 MG PO PACK: 40.0000 mg | PACK | Freq: Every day | ORAL | 0 refills | Status: AC

## 2019-03-13 MED ORDER — TIOTROPIUM BROMIDE MONOHYDRATE 18 MCG IN CAPS: 18.0000 ug | ORAL_CAPSULE | Freq: Every day | RESPIRATORY_TRACT | 12 refills | Status: AC

## 2019-03-13 MED ORDER — HYDROCODONE-ACETAMINOPHEN 7.5-325 MG/15ML PO SOLN: 10.0000 mL | Freq: Four times a day (QID) | ORAL | 0 refills | Status: DC | PRN

## 2019-03-13 MED ORDER — OSMOLITE 1.5 CAL PO LIQD: 1000.0000 mL | ORAL | 0 refills | Status: AC

## 2019-03-13 MED ORDER — DOCUSATE SODIUM 50 MG/5ML PO LIQD: 100.0000 mg | Freq: Two times a day (BID) | ORAL | 0 refills | Status: AC

## 2019-03-13 MED ORDER — PANTOPRAZOLE SODIUM 40 MG PO PACK: 40.0000 mg | PACK | Freq: Every day | ORAL | 0 refills | Status: DC

## 2019-03-13 NOTE — Progress Notes (Signed)
Follow up visit made to new referral for [facility] hospice services at home. Education provided to patient's niece Oscar Sanchez about hospice services, philosophy and team approach to care, all questions answered. AVS faxed to referral. Signed DNR and prescriptions accompanied patient at discharge. Flo Shanks BSN, RN, [place] [phone]

## 2019-03-13 NOTE — Discharge Summary (Signed)
Physician Discharge Summary  Author Slaven [id number] DOB: 19-Jun-1945 DOA: 03/04/2019  PCP: Wardell Honour, MD  Admit date: 03/04/2019 Discharge date: 03/13/2019  Admitted From: Home Disposition:  Home with Hospice  Recommendations for Outpatient Follow-up:  1. Follow up with PCP in 1-2 weeks 2. Please obtain BMP/CBC in one week 3. Please follow with oncology in 2-3 weeks  Home Health: Yes  Equipment/Devices: Tube feeding supplies   Discharge Condition: Guarded  CODE STATUS: DNR  Diet recommendation: Tube feeding  Brief/Interim Summary:  73 y.o.malewith a known history of esophageal diverticulum and achalasia status post PEG tube,admitted11/29/20for acute on chronicback pain,unable to eatand nausea.Patient had been unable to tolerate PEG tube feeds for several days prior to admission, due to intractable nausea. Palliative care consulted. Patient not surgical candidate to address esophageal issues unless able to gain 30 pounds. Imaging showed multiple liver lesions suspicious for metastatic malignancy.  IR performed ultrasound-guided biopsy which showed squamous cell carcinoma of unknown primary.  Oncology and palliative care were consulted.  Patient decided against pursuing further evaluation as he does not intend to pursue any treatment options.  His desire is to remain comfortable and enjoy his time with family at home.  Patient established with home hospice agency and home health services.  He is stable for discharge home today.  Expectations discussed in detail with patient and his niece at bedside this AM, prior to discharge.  They agree with plan and patient confirmed his DNR status for his niece, and desire to be kept comfortable.     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   Diet - low sodium heart healthy   Complete by: As directed    Increase activity slowly   Complete by: As directed      Allergies as of 03/13/2019   No Known Allergies     Medication List    STOP taking these medications   gabapentin 100 MG capsule Commonly known as: NEURONTIN   meloxicam 15 MG tablet Commonly known as: MOBIC   pantoprazole 40 MG tablet Commonly known as: PROTONIX Replaced by: pantoprazole sodium 40 mg/20 mL Pack   PEG 3350 17 GM/SCOOP Powd     TAKE these medications   albuterol 108 (90 Base) MCG/ACT inhaler Commonly known as: VENTOLIN HFA Inhale 2 puffs into the lungs every 6 (six) hours as needed for wheezing or shortness of breath.   docusate 50 MG/5ML liquid Commonly known as: COLACE Place 10 mLs (100 mg total) into feeding tube 2 (two) times daily.   feeding supplement (OSMOLITE 1.5 CAL) Liqd Place 1,000 mLs into feeding tube continuous. Tube feed pump at rate of 60 mL/hr, give 120 mL free water by tube every 4 hours. What changed:   how much to take  how to take this  when to take this  additional instructions   fentaNYL 12 MCG/HR Commonly known as: [place] 1 patch onto the skin every 3 (three)  days for 9 days.   free water Soln Place 120 mLs into feeding tube every 4 (four) hours.   HYDROcodone-acetaminophen 7.5-325 mg/15 ml solution Commonly known as: HYCET Place 10 mLs into feeding tube every 6 (six) hours as needed for moderate pain.   pantoprazole sodium 40 mg/20 mL Pack Commonly known as: PROTONIX Place 20 mLs (40 mg total) into feeding tube daily. Replaces:  pantoprazole 40 MG tablet   tiotropium 18 MCG inhalation capsule Commonly known as: SPIRIVA Place 1 capsule (18 mcg total) into inhaler and inhale daily. What changed: when to take this            Durable Medical Equipment  (From admission, onward)         Start     Ordered   03/09/19 1341  For home use only DME Tube feeding  Once     03/09/19 1342   03/06/19 0932  For home use only DME Other see comment  Once    Comments: continuos feeding pump  Question:  Length of Need  Answer:  Lifetime   03/06/19 0932          No Known Allergies  Consultations:  Oncology  Palliative Care  Hospice   Procedures/Studies: Dg Thoracic Spine 2 View  Result Date: 03/04/2019 CLINICAL DATA:  Diffuse back pain.  Generalized weakness EXAM: THORACIC SPINE 2 VIEWS COMPARISON:  None. FINDINGS: Negative for fracture or listhesis. Mild lower thoracic and midthoracic endplate spurring. No evidence of erosion or bone lesion. C5-6 and C6-7 interbody arthrodesis with multilevel laminectomy involving lower cervical and upper lumbar spine. There is exaggerated kyphosis at the cervicothoracic junction. Maintained posterior mediastinal fat planes. IMPRESSION: 1. No acute or focal finding. 2. Lower cervical ACDF and laminectomy with kyphosis at the cervicothoracic junction. Electronically Signed   By: Monte Fantasia M.D.   On: 03/04/2019 20:04   Dg Lumbar Spine 2-3 Views  Result Date: 03/04/2019 CLINICAL DATA:  Weakness.  Chronic back pain EXAM: LUMBAR SPINE - 2-3 VIEW COMPARISON:  None. FINDINGS: Negative for fracture or endplate erosion. Generally preserved disc height with mild endplate ridging. Degenerative facet spurring at L3-4 and below. Percutaneous gastrostomy tube.  Atherosclerotic calcification IMPRESSION: No acute finding. Ordinary degenerative changes. Electronically Signed   By: Monte Fantasia M.D.   On: 03/04/2019 20:02   Ct Chest W Contrast  Result Date: 03/06/2019 CLINICAL DATA:   Unintended weight loss. Nonlocalized abdominal pain. Technologist notes state COPD, progressive dysphagia, esophageal diverticula and achalasia. G-tube placement 12/01/2018. Admitted for acute on chronic back pain malnutrition and anemia. EXAM: CT CHEST, ABDOMEN, AND PELVIS WITH CONTRAST TECHNIQUE: Multidetector CT imaging of the chest, abdomen and pelvis was performed following the standard protocol during bolus administration of intravenous contrast. CONTRAST:  37mL OMNIPAQUE IOHEXOL 300 MG/ML  SOLN COMPARISON:  Recent chest and spine radiographs, no prior cross-sectional imaging. FINDINGS: CT CHEST FINDINGS Cardiovascular: Atherosclerosis and tortuosity of the thoracic aorta. Irregular plaque in the distal descending thoracic aorta. No dissection or acute aortic syndrome. Heart is normal in size. No pericardial effusion. There are coronary artery calcifications. No filling defects in the central most pulmonary arteries to the lobar level to suggest pulmonary embolus. Mediastinum/Nodes: Dilated fluid-filled esophagus consistent with achalasia. Small focal outpouching from the distal esophagus, series 2, image 44, likely represents esophageal diverticulum. No inflammatory change or perforation. There is irregular wall thickening of the distal esophagus just proximal to the gastroesophageal junction. Borderline prevascular nodes measuring up to 10 mm. Small upper right paratracheal  nodes, all subcentimeter. No enlarged hilar lymph nodes. No visualized thyroid nodule. Lungs/Pleura: Mild emphysema. Scattered areas of subpleural scarring in both lower lobes. Patchy area of tree in bud opacities in the superior segment of the right lower lobe, series 4, image 83. No pulmonary edema or confluent airspace disease. No pleural fluid. Trachea and mainstem bronchi are patent. No pulmonary mass. Musculoskeletal: There are no acute or suspicious osseous abnormalities. Scattered areas of calcification in the dorsal spinal canal  without frank canal impingement. CT ABDOMEN PELVIS FINDINGS Hepatobiliary: There are 2 enhancing lesions in the left lobe of the liver, 17 and 16 mm both series 2, image 55. Punctate hepatic granuloma in the right lobe. Gallbladder physiologically distended, no calcified stone. Common bile duct is dilated at 10 mm. No visualized choledocholithiasis. Pancreas: Mildly dilated proximal pancreatic duct at 4 mm. No obvious focal pancreatic mass. No peripancreatic inflammation. Spleen: Normal in size. Splenule anteriorly. No evidence of focal lesion, allowing for arterial phase imaging. Adrenals/Urinary Tract: Normal right adrenal gland. Left adrenal thickening without dominant nodule. Hydronephrosis. There is bilateral perinephric edema. Homogeneous enhancement with symmetric excretion on delayed phase imaging. No evidence of focal renal mass. Urinary bladder is partially distended. No obvious bladder wall thickening. Stomach/Bowel: Gastrostomy tube with balloon appropriately positioned in the stomach. Low density abutting the gastric lesser curvature is felt to represent necrotic node rather than gastric diverticulum. No bowel obstruction. Administered enteric contrast reaches the colon. No bowel wall thickening or inflammatory change. No evidence of colonic mass. Normal appendix. Vascular/Lymphatic: Abnormal bulky retroperitoneal adenopathy extending from the level of the renal vasculature to the iliac bifurcation. Some of these nodes demonstrate central low density consistent with necrosis. Representative nodal conglomerate in the left periaortic station at the level just below the renal veins measures 4.2 x 2.3 cm, series 2, image 74. Round soft tissue density with central low-density the upper abdomen abutting the gastric cardia/lesser curvature felt to represent a necrotic node measuring 2.0 x 1.9 cm, series 2, image 57. There are additional enlarged nodes in the porta hepatis and gastrohepatic ligament. No  definite pelvic adenopathy. Advanced aortic atherosclerosis with irregular plaque in the mid aorta causing approximately 50% luminal narrowing. Limited assessment for portal vein and IVC patency given phase of contrast, however on delayed phase imaging, no obvious venous thrombus is seen. Reproductive: Prominent prostate gland spans 5.3 cm. Central prostatic calcifications. No obvious prostatic mass. Other: Bilateral retroperitoneal stranding, left greater than right. No significant ascites. No definite omental nodularity. No free air. Musculoskeletal: There are no acute or suspicious osseous abnormalities. Degenerative change in the lumbar spine. IMPRESSION: 1. Bulky retroperitoneal adenopathy, some of which appears necrotic with central low density. There also 2 enhancing lesions in the left lobe of the liver, in this setting are suspicious for metastatic disease. Primary malignancy not definitively characterize. Patient has achalasia, with irregular wall thickening of the distal esophagus, which could be a source of primary malignancy. Consider further evaluation with endoscopy. Recommend oncologic referral. 2. Borderline prevascular nodes at 10 mm, nonspecific. 3. Common bile duct dilatation at 10 mm with prominence of the pancreatic duct of 4 mm. No obvious pancreatic mass or cause of obstruction. This could be further evaluated with MRCP, if patient is able to tolerate breath hold technique. 4. Thoracoabdominal aortic atherosclerosis with irregular plaque of the distal descending thoracic and infrarenal abdominal aorta. 5. Emphysema. 6. Mild patchy tree in bud opacities in the right lower lobe, likely infectious or inflammatory. 7. Bilateral perinephric edema  which is nonspecific, and may be chronic. Aortic Atherosclerosis (ICD10-I70.0) and Emphysema (ICD10-J43.9). Electronically Signed   By: Keith Rake M.D.   On: 03/06/2019 14:58   Ct Abdomen Pelvis W Contrast  Result Date: 03/06/2019 CLINICAL DATA:   Unintended weight loss. Nonlocalized abdominal pain. Technologist notes state COPD, progressive dysphagia, esophageal diverticula and achalasia. G-tube placement 12/01/2018. Admitted for acute on chronic back pain malnutrition and anemia. EXAM: CT CHEST, ABDOMEN, AND PELVIS WITH CONTRAST TECHNIQUE: Multidetector CT imaging of the chest, abdomen and pelvis was performed following the standard protocol during bolus administration of intravenous contrast. CONTRAST:  55mL OMNIPAQUE IOHEXOL 300 MG/ML  SOLN COMPARISON:  Recent chest and spine radiographs, no prior cross-sectional imaging. FINDINGS: CT CHEST FINDINGS Cardiovascular: Atherosclerosis and tortuosity of the thoracic aorta. Irregular plaque in the distal descending thoracic aorta. No dissection or acute aortic syndrome. Heart is normal in size. No pericardial effusion. There are coronary artery calcifications. No filling defects in the central most pulmonary arteries to the lobar level to suggest pulmonary embolus. Mediastinum/Nodes: Dilated fluid-filled esophagus consistent with achalasia. Small focal outpouching from the distal esophagus, series 2, image 44, likely represents esophageal diverticulum. No inflammatory change or perforation. There is irregular wall thickening of the distal esophagus just proximal to the gastroesophageal junction. Borderline prevascular nodes measuring up to 10 mm. Small upper right paratracheal nodes, all subcentimeter. No enlarged hilar lymph nodes. No visualized thyroid nodule. Lungs/Pleura: Mild emphysema. Scattered areas of subpleural scarring in both lower lobes. Patchy area of tree in bud opacities in the superior segment of the right lower lobe, series 4, image 83. No pulmonary edema or confluent airspace disease. No pleural fluid. Trachea and mainstem bronchi are patent. No pulmonary mass. Musculoskeletal: There are no acute or suspicious osseous abnormalities. Scattered areas of calcification in the dorsal spinal canal  without frank canal impingement. CT ABDOMEN PELVIS FINDINGS Hepatobiliary: There are 2 enhancing lesions in the left lobe of the liver, 17 and 16 mm both series 2, image 55. Punctate hepatic granuloma in the right lobe. Gallbladder physiologically distended, no calcified stone. Common bile duct is dilated at 10 mm. No visualized choledocholithiasis. Pancreas: Mildly dilated proximal pancreatic duct at 4 mm. No obvious focal pancreatic mass. No peripancreatic inflammation. Spleen: Normal in size. Splenule anteriorly. No evidence of focal lesion, allowing for arterial phase imaging. Adrenals/Urinary Tract: Normal right adrenal gland. Left adrenal thickening without dominant nodule. Hydronephrosis. There is bilateral perinephric edema. Homogeneous enhancement with symmetric excretion on delayed phase imaging. No evidence of focal renal mass. Urinary bladder is partially distended. No obvious bladder wall thickening. Stomach/Bowel: Gastrostomy tube with balloon appropriately positioned in the stomach. Low density abutting the gastric lesser curvature is felt to represent necrotic node rather than gastric diverticulum. No bowel obstruction. Administered enteric contrast reaches the colon. No bowel wall thickening or inflammatory change. No evidence of colonic mass. Normal appendix. Vascular/Lymphatic: Abnormal bulky retroperitoneal adenopathy extending from the level of the renal vasculature to the iliac bifurcation. Some of these nodes demonstrate central low density consistent with necrosis. Representative nodal conglomerate in the left periaortic station at the level just below the renal veins measures 4.2 x 2.3 cm, series 2, image 74. Round soft tissue density with central low-density the upper abdomen abutting the gastric cardia/lesser curvature felt to represent a necrotic node measuring 2.0 x 1.9 cm, series 2, image 57. There are additional enlarged nodes in the porta hepatis and gastrohepatic ligament. No  definite pelvic adenopathy. Advanced aortic atherosclerosis with irregular plaque in  the mid aorta causing approximately 50% luminal narrowing. Limited assessment for portal vein and IVC patency given phase of contrast, however on delayed phase imaging, no obvious venous thrombus is seen. Reproductive: Prominent prostate gland spans 5.3 cm. Central prostatic calcifications. No obvious prostatic mass. Other: Bilateral retroperitoneal stranding, left greater than right. No significant ascites. No definite omental nodularity. No free air. Musculoskeletal: There are no acute or suspicious osseous abnormalities. Degenerative change in the lumbar spine. IMPRESSION: 1. Bulky retroperitoneal adenopathy, some of which appears necrotic with central low density. There also 2 enhancing lesions in the left lobe of the liver, in this setting are suspicious for metastatic disease. Primary malignancy not definitively characterize. Patient has achalasia, with irregular wall thickening of the distal esophagus, which could be a source of primary malignancy. Consider further evaluation with endoscopy. Recommend oncologic referral. 2. Borderline prevascular nodes at 10 mm, nonspecific. 3. Common bile duct dilatation at 10 mm with prominence of the pancreatic duct of 4 mm. No obvious pancreatic mass or cause of obstruction. This could be further evaluated with MRCP, if patient is able to tolerate breath hold technique. 4. Thoracoabdominal aortic atherosclerosis with irregular plaque of the distal descending thoracic and infrarenal abdominal aorta. 5. Emphysema. 6. Mild patchy tree in bud opacities in the right lower lobe, likely infectious or inflammatory. 7. Bilateral perinephric edema which is nonspecific, and may be chronic. Aortic Atherosclerosis (ICD10-I70.0) and Emphysema (ICD10-J43.9). Electronically Signed   By: Keith Rake M.D.   On: 03/06/2019 14:58   US Biopsy (liver)  Result Date: 03/07/2019 INDICATION: No known  primary, now with thickening of the distal esophagus and associated adenopathy and ill-defined lesions within the left lobe of the liver worrisome for metastatic disease. As such, request made for ultrasound-guided liver lesion biopsy for tissue diagnostic purposes. EXAM: ULTRASOUND GUIDED LIVER LESION BIOPSY COMPARISON:  CT of the chest, abdomen and pelvis-03/06/2019 MEDICATIONS: None ANESTHESIA/SEDATION: Fentanyl 50 mcg IV; Versed 1 mg IV Total Moderate Sedation time:  10 Minutes. The patient's level of consciousness and vital signs were monitored continuously by radiology nursing throughout the procedure under my direct supervision. COMPLICATIONS: None immediate. PROCEDURE: Informed written consent was obtained from the patient after a discussion of the risks, benefits and alternatives to treatment. The patient understands and consents the procedure. A timeout was performed prior to the initiation of the procedure. Ultrasound scanning was performed of the right upper abdominal quadrant demonstrates an approximately 1.5 x 1.5 lesion within the subcapsular aspect the left lobe of the liver correlating with the lesion seen on preceding abdominal CT image 55, series 2). The procedure was planned. The midline of the abdomen was prepped and draped in the usual sterile fashion. The overlying soft tissues were anesthetized with 1% lidocaine with epinephrine. A 17 gauge, 6.8 cm co-axial needle was advanced into a peripheral aspect of the lesion. This was followed by 4 core biopsies with an 18 gauge core device under direct ultrasound guidance. Multiple ultrasound images were saved for procedural documentation purposes. The coaxial needle tract was embolized with a small amount of Gel-Foam slurry and superficial hemostasis was obtained with manual compression. Post procedural scanning was negative for definitive area of hemorrhage or additional complication. A dressing was placed. The patient tolerated the procedure well  without immediate post procedural complication. IMPRESSION: Technically successful ultrasound guided core needle biopsy of indeterminate lesion within the subcapsular aspect of the left lobe of the liver. Electronically Signed   By: Sandi Mariscal M.D.   On: 03/07/2019  12:51   Dg Chest Portable 1 View  Result Date: 03/04/2019 CLINICAL DATA:  Weakness EXAM: PORTABLE CHEST 1 VIEW COMPARISON:  May 08, 2018 FINDINGS: Lungs are hyperexpanded. No edema or consolidation. Heart size and pulmonary vascularity are normal. No adenopathy. Gastrostomy catheter positioned in left upper quadrant. IMPRESSION: Lungs hyperexpanded. No edema or consolidation. Cardiac silhouette within normal limits. No adenopathy evident. Electronically Signed   By: Lowella Grip III M.D.   On: 03/04/2019 15:34      Ultrasound guided liver biopsy    Subjective: Patient seen this Am with niece at bedside.  Patient states pain well controlled.  Addressed all questions for his niece regarding care at home and expectations.  Patient has no acute complaints today.    Discharge Exam: Vitals:   03/13/19 0241 03/13/19 0740  BP: (!) 102/55 98/60  Pulse: 78 84  Resp: 16   Temp: 98.9 F (37.2 C)   SpO2: 94% 95%   Vitals:   03/12/19 0955 03/12/19 1610 03/13/19 0241 03/13/19 0740  BP: (!) 99/57 (!) 94/57 (!) 102/55 98/60  Pulse: 86 84 78 84  Resp: 18 20 16    Temp: 98 F (36.7 C) 98.7 F (37.1 C) 98.9 F (37.2 C)   TempSrc: Oral Oral Oral   SpO2: 94% 96% 94% 95%  Weight:      Height:        General: Pt is alert, awake, not in acute distress, cachetic Cardiovascular: RRR, S1/S2 +, no rubs, no gallops Respiratory: CTA bilaterally, no wheezing, no rhonchi Abdominal: Soft, NT, ND, bowel sounds +, PEG in place Extremities: no edema, no cyanosis    The results of significant diagnostics from this hospitalization (including imaging, microbiology, ancillary and laboratory) are listed below for reference.      Microbiology: Recent Results (from the past 240 hour(s))  Culture, blood (routine x 2)     Status: None   Collection Time: 03/04/19  3:15 PM   Specimen: BLOOD  Result Value Ref Range Status   Specimen Description BLOOD RIGHT ANTECUBITAL  Final   Special Requests   Final    BOTTLES DRAWN AEROBIC AND ANAEROBIC Blood Culture results may not be optimal due to an inadequate volume of blood received in culture bottles   Culture   Final    NO GROWTH 5 DAYS Performed at [facility], [address], [place], [place] [address]    Report Status 03/09/2019 FINAL  Final  Culture, blood (routine x 2)     Status: None   Collection Time: 03/04/19  5:42 PM   Specimen: BLOOD  Result Value Ref Range Status   Specimen Description BLOOD FOREARM  Final   Special Requests   Final    BOTTLES DRAWN AEROBIC AND ANAEROBIC Blood Culture adequate volume   Culture   Final    NO GROWTH 5 DAYS Performed at [facility], [address], [place], [place] [address]    Report Status 03/09/2019 FINAL  Final  SARS CORONAVIRUS 2 (TAT 6-24 HRS) Nasopharyngeal Nasopharyngeal Swab     Status: None   Collection Time: 03/04/19  6:25 PM   Specimen: Nasopharyngeal Swab  Result Value Ref Range Status   SARS Coronavirus 2 NEGATIVE NEGATIVE Final    Comment: (NOTE) SARS-CoV-2 target nucleic acids are NOT DETECTED. The SARS-CoV-2 RNA is generally detectable in upper and lower respiratory specimens during the acute phase of infection. Negative results do not preclude SARS-CoV-2 infection, do not rule out co-infections with other pathogens, and  should not be used as the sole basis for treatment or other patient management decisions. Negative results must be combined with clinical observations, patient history, and epidemiological information. The expected result is Negative. Fact Sheet for Patients: SugarRoll.be Fact Sheet for Healthcare  Providers: https://www.woods-mathews.com/ This test is not yet approved or cleared by the [place] FDA and  has been authorized for detection and/or diagnosis of SARS-CoV-2 by FDA under an Emergency Use Authorization (EUA). This EUA will remain  in effect (meaning this test can be used) for the duration of the COVID-19 declaration under Section 56 4(b)(1) of the Act, 21 U.S.C. section 360bbb-3(b)(1), unless the authorization is terminated or revoked sooner. Performed at [facility], [place] [address] [place], [place] [address]      Labs: BNP (last 3 results) No results for input(s): BNP in the last 8760 hours. Basic Metabolic Panel: Recent Labs  Lab 03/07/19 0345 03/08/19 0521 03/10/19 0905  NA 136 133*  --   K 3.6 3.9  --   CL 101 99  --   CO2 27 26  --   GLUCOSE 171* 158*  --   BUN 19 13  --   CREATININE 0.82 0.74 0.63  CALCIUM 9.3 9.4  --   MG  --  2.2  --   PHOS  --  2.7  --    Liver Function Tests: No results for input(s): AST, ALT, ALKPHOS, BILITOT, PROT, ALBUMIN in the last 168 hours. No results for input(s): LIPASE, AMYLASE in the last 168 hours. No results for input(s): AMMONIA in the last 168 hours. CBC: Recent Labs  Lab 03/08/19 0521 03/10/19 0905  WBC 9.3 9.5  HGB 11.4* 10.5*  HCT 33.9* 32.6*  MCV 82.5 85.6  PLT 151 177   Cardiac Enzymes: No results for input(s): CKTOTAL, CKMB, CKMBINDEX, TROPONINI in the last 168 hours. BNP: Invalid input(s): POCBNP CBG: Recent Labs  Lab 03/10/19 2031 03/11/19 0004 03/11/19 0425 03/11/19 0838 03/11/19 1143  GLUCAP 169* 163* 184* 179* 161*   D-Dimer No results for input(s): DDIMER in the last 72 hours. Hgb A1c No results for input(s): HGBA1C in the last 72 hours. Lipid Profile No results for input(s): CHOL, HDL, LDLCALC, TRIG, CHOLHDL, LDLDIRECT in the last 72 hours. Thyroid function studies No results for input(s): TSH, T4TOTAL, T3FREE, THYROIDAB in the last 72 hours.  Invalid  input(s): FREET3 Anemia work up No results for input(s): VITAMINB12, FOLATE, FERRITIN, TIBC, IRON, RETICCTPCT in the last 72 hours. Urinalysis No results found for: COLORURINE, APPEARANCEUR, [place], [place], [place], [place], [place], [place], [place], UROBILINOGEN, NITRITE, LEUKOCYTESUR Sepsis Labs Invalid input(s): PROCALCITONIN,  WBC,  LACTICIDVEN Microbiology Recent Results (from the past 240 hour(s))  Culture, blood (routine x 2)     Status: None   Collection Time: 03/04/19  3:15 PM   Specimen: BLOOD  Result Value Ref Range Status   Specimen Description BLOOD RIGHT ANTECUBITAL  Final   Special Requests   Final    BOTTLES DRAWN AEROBIC AND ANAEROBIC Blood Culture results may not be optimal due to an inadequate volume of blood received in culture bottles   Culture   Final    NO GROWTH 5 DAYS Performed at [facility], [address], [place], [place] [address]    Report Status 03/09/2019 FINAL  Final  Culture, blood (routine x 2)     Status: None   Collection Time: 03/04/19  5:42 PM   Specimen: BLOOD  Result Value Ref Range Status  Specimen Description BLOOD FOREARM  Final   Special Requests   Final    BOTTLES DRAWN AEROBIC AND ANAEROBIC Blood Culture adequate volume   Culture   Final    NO GROWTH 5 DAYS Performed at [facility], [address] [place], [place] [address]    Report Status 03/09/2019 FINAL  Final  SARS CORONAVIRUS 2 (TAT 6-24 HRS) Nasopharyngeal Nasopharyngeal Swab     Status: None   Collection Time: 03/04/19  6:25 PM   Specimen: Nasopharyngeal Swab  Result Value Ref Range Status   SARS Coronavirus 2 NEGATIVE NEGATIVE Final    Comment: (NOTE) SARS-CoV-2 target nucleic acids are NOT DETECTED. The SARS-CoV-2 RNA is generally detectable in upper and lower respiratory specimens during the acute phase of infection. Negative results do not preclude SARS-CoV-2 infection, do not rule out co-infections with other pathogens, and should  not be used as the sole basis for treatment or other patient management decisions. Negative results must be combined with clinical observations, patient history, and epidemiological information. The expected result is Negative. Fact Sheet for Patients: SugarRoll.be Fact Sheet for Healthcare Providers: https://www.woods-mathews.com/ This test is not yet approved or cleared by the [place] FDA and  has been authorized for detection and/or diagnosis of SARS-CoV-2 by FDA under an Emergency Use Authorization (EUA). This EUA will remain  in effect (meaning this test can be used) for the duration of the COVID-19 declaration under Section 56 4(b)(1) of the Act, 21 U.S.C. section 360bbb-3(b)(1), unless the authorization is terminated or revoked sooner. Performed at [facility], [place] [address] [place],  [address]      Time coordinating discharge: Over 30 minutes  SIGNED:   Ezekiel Slocumb, DO [facility] 03/13/2019, 9:50 AM Pager [phone]  If 7PM-7AM, please contact night-coverage [email or web address] Password [email or web address]

## 2019-03-13 NOTE — TOC Transition Note (Signed)
Transition of Care [facility]) - CM/SW Discharge Note   Patient Details  Name: Yunior Boxx MRN: [id number] Date of Birth: 1945-07-03  [facility] of [facility] CM/SW Contact:  Su Hilt, RN Phone Number: 03/13/2019, 9:45 AM   Clinical Narrative:    The patient to DC home today with Hospice services, Santiago Glad with Hospice setting up care, Sister Shelbie Ammons notified By Santiago Glad about the DC, Sister to provide transportation   Final next level of care: Home w Hospice Care Barriers to Discharge: Barriers Resolved   Patient Goals and CMS Choice Patient states their goals for this hospitalization and ongoing recovery are:: get better      Discharge Placement                  Name of family member notified: Daryll Brod Sisiter Patient and family notified of of transfer: 03/13/19  Discharge Plan and Services   Discharge Planning Services: CM Consult            DME Arranged: Tube feeding pump, Tube feeding         HH Arranged: PT, OT, Nurse's Aide, Social Work, Therapist, sports [place] Agency: [facility] [place] Date [place]: 03/05/19 Time [place]: [place] Representative spoke with at [place]: [place] ([place]) Interventions     Readmission Risk Interventions No flowsheet data found.

## 2019-03-16 NOTE — Progress Notes (Signed)
Called the Patient and spoke with the family members who are taking care of the patient. His PPI meds was reviewed. Called back his pharm. At [phone] Rx. For Pentoprazol 40mg /5 ml was called in with  2RF Dr. Roger Shelter

## 2019-03-26 ENCOUNTER — Inpatient Hospital Stay (HOSPITAL_BASED_OUTPATIENT_CLINIC_OR_DEPARTMENT_OTHER): Payer: Medicare PPO | Admitting: Hospice and Palliative Medicine

## 2019-03-26 ENCOUNTER — Inpatient Hospital Stay: Payer: Medicare PPO | Attending: Internal Medicine | Admitting: Internal Medicine

## 2019-03-26 ENCOUNTER — Other Ambulatory Visit: Payer: Self-pay

## 2019-03-26 ENCOUNTER — Telehealth: Payer: Self-pay

## 2019-03-26 DIAGNOSIS — C801 Malignant (primary) neoplasm, unspecified: Secondary | ICD-10-CM | POA: Diagnosis not present

## 2019-03-26 DIAGNOSIS — Z515 Encounter for palliative care: Secondary | ICD-10-CM | POA: Diagnosis not present

## 2019-03-26 DIAGNOSIS — C787 Secondary malignant neoplasm of liver and intrahepatic bile duct: Secondary | ICD-10-CM

## 2019-03-26 DIAGNOSIS — G893 Neoplasm related pain (acute) (chronic): Secondary | ICD-10-CM | POA: Diagnosis not present

## 2019-03-26 NOTE — Progress Notes (Signed)
I connected with [name] on 03/26/2019 at 11:15 AM EST by video enabled telemedicine visit and verified that I am speaking with the correct person using two identifiers.  I discussed the limitations, risks, security and privacy concerns of performing an evaluation and management service by telemedicine and the availability of in-person appointments. I also discussed with the patient that there may be a patient responsible charge related to this service. The patient expressed understanding and agreed to proceed.    Other persons participating in the visit and their role in the encounter: RN/medical reconciliation; patient's niece Leotis Shames Patient's location: home Provider's location: office  Oncology History   No history exists.     Chief Complaint: Metastatic squamous cell cancer   History of present illness:Oscar Sanchez 73 y.o.  male with history of recently diagnosed metastatic squamous cell cancer currently in hospice at home.   Patient states his pain is poorly controlled.  Having pain "all over".  As per family patient having increasing pain especially nighttime.  Patient is currently getting sublingual morphine sulfate every 2 hours; and also hydrocodone through PEG tube every 6 hours.  However this is not well controlling the pain.  Patient has difficulty sleeping at night because of pain.  Patient is getting weaker.  He is barely able to get up by himself to use the bedside commode.   Patient continues to use his PEG tube for his feeds.  However noted to have increased resistance on patient the tube feeds.  Also patient noted to have fecal incontinence.  Is currently in depends.     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  Primary unclear-clinically quite possible esophagus.  Patient currently in hospice at home.  #Given the multitude of above symptoms of poor pain control/generalized weakness worsening debility-likely from progression of cancer.  Discussed regarding admission to hospice home for pain control/management of immediate symptoms.  Patient as per family declines.  #Pain control-discussed with [employer]; plan morphine infusion/as per discussion with hospitalist attending.  # Weight loss/poor p.o. intake-from progressive malignancy; s/p PEG tube.  Given the resistance with tube feeds-recommend bolus feeding; plan nutrition follow-up.  #DNR/DNI. Discussed with [employer].   Follow-up instructions:  I discussed the assessment and treatment plan with the patient.  The patient was provided an opportunity to ask questions and all were answered.  The patient agreed with the plan and demonstrated understanding of instructions.  The patient was advised to call back or seek an in person evaluation if the symptoms worsen or if the condition fails to improve as anticipated.  I provided 25 minutes of face-to-face video visit time during this encounter, and > 50% was spent counseling as documented under my assessment & plan.   Dr. Charlaine Dalton Roseto at [facility] 03/27/2019 8:09 AM

## 2019-03-26 NOTE — Progress Notes (Signed)
pt is now living with [place]. She is managing all his meds with hospice. Caregiver is very knowledge. pt reports that pt has 1-2 loose stool per day. he d/c the colace in feeding tube. Caregiver reports pt switched to sublingual morphine to control pain. Pt had to d/c the fentanyl patch this week. family would like to collaborate with hospice and Dr. Jonna Coup care to discuss the option for pain pump. pt is not sleeping due to the pain. did start lorazepam 0.25mg  tablet crushing the tablet twice daily this weekend to take the edge of pt's anxiety.

## 2019-03-26 NOTE — Progress Notes (Signed)
Virtual Visit via Telephone Note  I connected with Oscar Sanchez on 03/26/19 at 11:30 AM EST by telephone and verified that I am speaking with the correct person using two identifiers.   I discussed the limitations, risks, security and privacy concerns of performing an evaluation and management service by telephone and the availability of in person appointments. I also discussed with the patient that there may be a patient responsible charge related to this service. The patient expressed understanding and agreed to proceed.   History of Present Illness: Mr. 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  Patient had been unable to tolerate PEG feedings.  Imaging revealed multiple liver lesions suspicious for metastatic disease.  Patient underwent ultrasound-guided biopsy and pathology was positive for squamous cell carcinoma of unknown primary.  Patient and family opted not to pursue further evaluation or treatment.  He was discharged home with hospice.  Palliative care was consulted to help address goals and manage ongoing symptoms.   Observations/Objective: I called and spoke with patient and his niece by phone.  Patient has had severe and intractable shoulder and back pain requiring escalation of his opioids.  Pain remains poorly controlled and is rated as 7-8 out of 10 currently.  Patient has been taking 10 mg morphine elixir every 2 hours around-the-clock.  He is also been taking Hycet 7.5-325 mg every 6 hours.  Patient has been taking 0.5 mg lorazepam at night.  Pain is reportedly worse at night and he has been only able to sleep minutes to an hour or two each night.  Additionally, his niece says that she is getting very little sleep due to nighttime dosing of his opioids.  Discussed options for  opioid regimen.  Patient may be a good candidate for initiation of a PCA versus use of methadone elixir via PEG as a long-acting opioid.  Patient has recently started having abdominal discomfort.  He is receiving continuous PEG feedings at 60 mL an hour.  Niece says patient feels full and bloated.  She has noticed that there has been some leakage of stomach content when she disconnects the pump from the PEG.  Patient is having bowel movements with fecal incontinence.  I suspect that patient is not tolerating feedings at current rate.  Discussed with RD who will also contact patient.  Would likely recommend reducing rate of tube feedings or switching to bolus feedings.  I called and spoke with patient's hospice nurse, Casimer Bilis.  Assessment and Plan: Metastatic squamous cell of unknown primary -best supportive care.  Patient is currently followed by hospice at home.  Neoplasm related pain - Would agree with initiation of morphine PCA. He did receive morphine while inpatient and tolerated. Unfortunately, other options for pain control are limited. Methadone could be considered. Xtampza ER would be cost prohibitive under hospice.   Nutrition - would suggest reducing feeding rate or switching to bolus. Patient unlikely to meet his caloric needs and would prioritize symptom relief/comfort. Discussed with Joli, RD who will call patient this afternoon.   ACP - he was a DNR while hospitalized.  Case and plan discussed with Dr. Rogue Bussing, who has also talked with patient/family.   Follow Up Instructions: Follow up telephone visits as needed   I discussed the assessment and treatment plan with the patient. The patient was provided an opportunity to ask  questions and all were answered. The patient agreed with the plan and demonstrated an understanding of the instructions.   The patient was advised to call back or seek an in-person evaluation if the symptoms worsen or if the condition fails to improve as  anticipated.  I provided 30 minutes of non-face-to-face time during this encounter.   Irean Hong, NP

## 2019-03-26 NOTE — Assessment & Plan Note (Addendum)
#  73 year old male patient with recent diagnosis of achalasia/weight loss-s/p PEG tube- Liver lesions/bulky retroperitoneal adenopathy-biopsy positive for metastatic squamous cell carcinoma.  Primary unclear-clinically quite possible esophagus.  Patient currently in hospice at home.  #Given the multitude of above symptoms of poor pain control/generalized weakness worsening debility-likely from progression of cancer.  Discussed regarding admission to hospice home for pain control/management of immediate symptoms.  Patient as per family declines.  #Pain control-discussed with [employer]; plan morphine infusion/as per discussion with hospitalist attending.  # Weight loss/poor p.o. intake-from progressive malignancy; s/p PEG tube.  Given the resistance with tube feeds-recommend bolus feeding; plan nutrition follow-up.  #DNR/DNI. Discussed with [employer].

## 2019-03-26 NOTE — Telephone Encounter (Signed)
Nutrition Assessment   Reason for Assessment:   Referral from [place], NP regarding tube feeding, patient feeling bloated, full   ASSESSMENT:  73 year old male with history of esophageal diverticulum and achalasia s/p PEG (12/01/2018). Recent hospital admission 11/29-12/11/2018 with back pain, poor oral intake and nausea.  Patient found to have multiple liver lesions and pathology positive for squamous cell carcinoma of unknown primary.    Spoke with niece Joelene Millin via phone and patient.  Niece reports that patient has been using osmolite 1.5 at 60 ml/hr for 24 hr continuous pump via PEG tube.  Reports patient feeling bloated, full and leaking of stomach contents/formula when she disconnects the pump from PEG. Niece reports fecal incontinence.  Patient is followed by hospice.   Nutrition Focused Physical Exam: deferred   Medications: reviewed  Anthropometrics:   Height: 69 inches Weight: 100 lb (hospital weight) BMI: 14   INTERVENTION:  Spoke with Josh, NP and goal is for comfort and prioritize symptom relief.  Niece aware of goals.  Niece reports that patient is not ready to turn tube feeding off completely.  Asked niece to stop tube feeding for 2 hours and restart  at 73ml/hr.  Discussed with niece to only pour amount of food in bag that she will use in 8 hours (at this rate will be 1 carton).  Niece will change out new bag set q 24 hours.  If patient tolerating at lower rate and wants to increase would increase by 39ml q 24 hours not to exceed 19ml/hr.  RD spoke with patient via phone regarding rate decrease and patient agreeable to lowering rate at this time to help with symptom relief.   Provided niece with contact information.    MONITORING, EVALUATION, GOAL: TF tolerance, symptoms   Next Visit: Dec 23rd phone call  Jahniyah Revere B. Zenia Resides, [place], [place] Registered Dietitian [phone] (pager)

## 2019-03-28 ENCOUNTER — Telehealth: Payer: Self-pay

## 2019-03-28 NOTE — Telephone Encounter (Signed)
Brief Nutrition Follow-up:  Spoke with patient's niece, Oscar Sanchez as promised from phone call on 12/21.  Oscar Sanchez has reduced rate of tube feeding down to 77ml/hr and it has helped with patient's symptoms.  Reports last night did turn tube feeding off due to patient not feeling well.  Reports that patient has not been upset with receiving less tube feeding.    Oscar Sanchez to continue to focus on comfort for patient and knows to discontinue tube feeding if not providing comfort.  Receiving support from hospice team.  Oscar Sanchez appreciative of support from Dr Pete Glatter, NP and RD.   Oscar Sanchez has contact information and will reach out if needed.   Adi Seales B. Zenia Resides, [place], [place] Registered Dietitian [phone] (pager)

## 2019-04-06 DEATH — deceased

## 2021-02-04 IMAGING — DX DG CHEST 1V PORT
1 series · 1 of 1 positions shown · non-contrast
Comparison: May 08, 2018

CLINICAL DATA: Weakness

EXAM:
PORTABLE CHEST 1 VIEW

[chest ap]
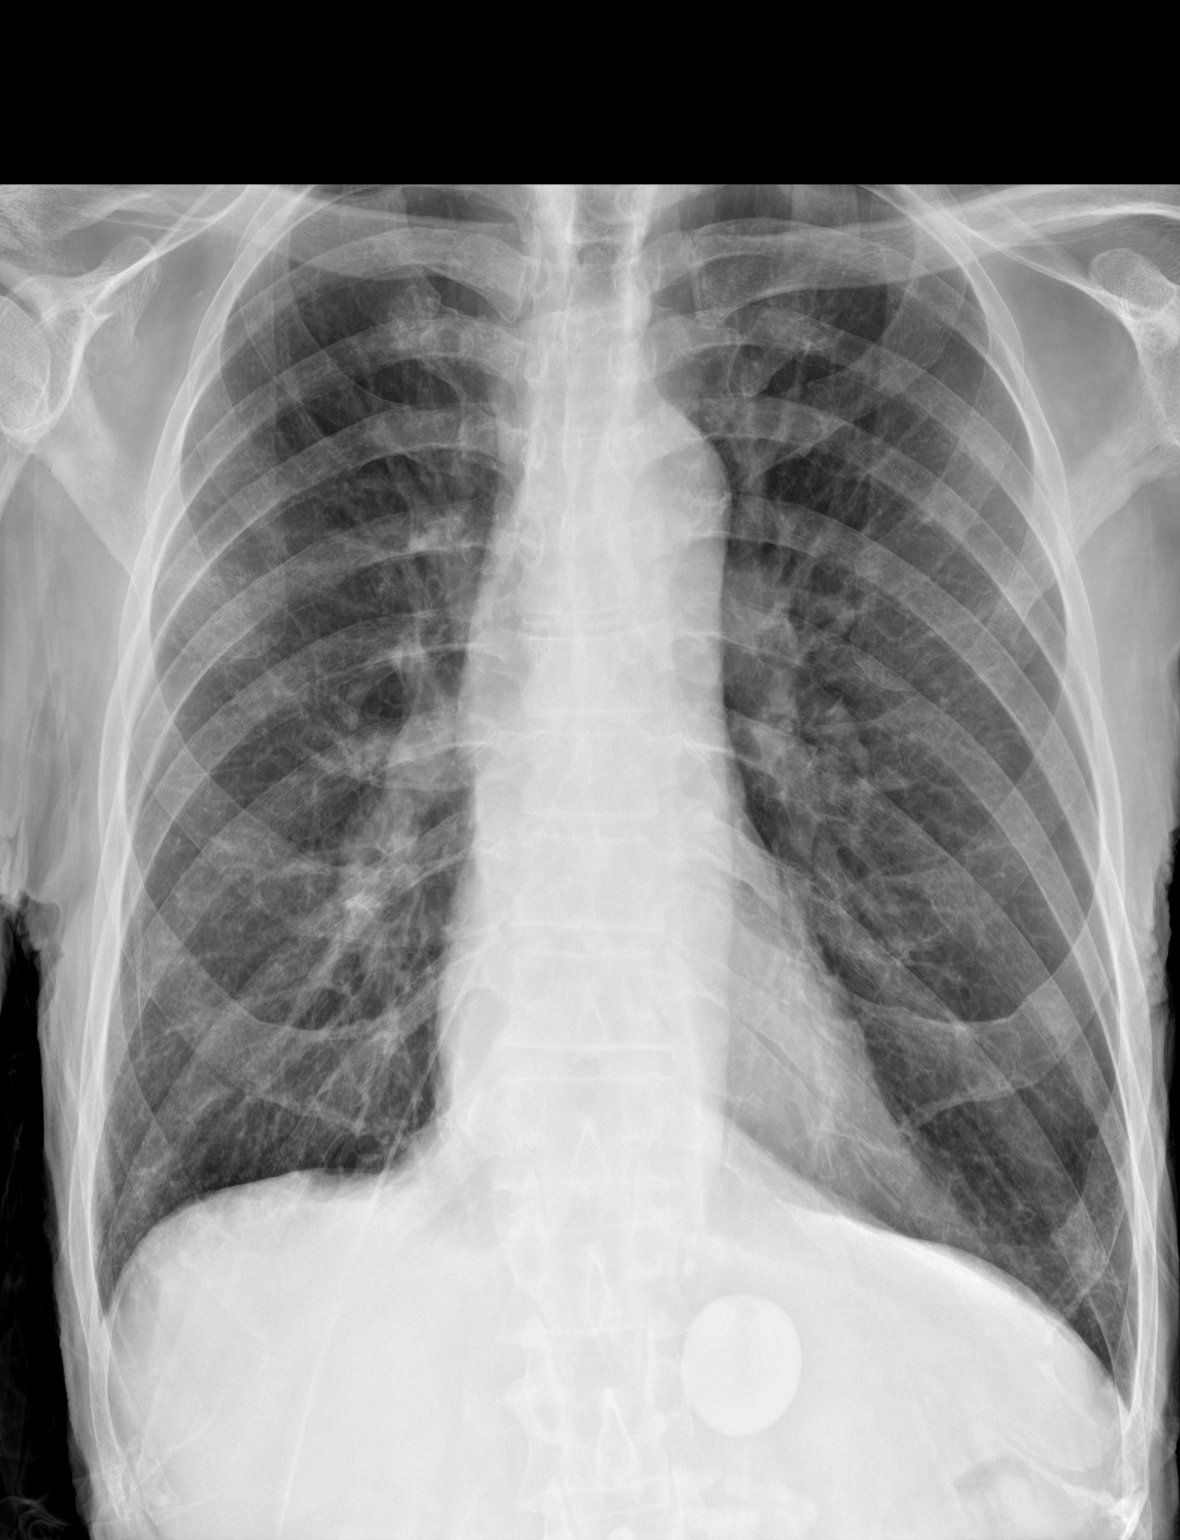

[1 of 1 positions shown; findings below may reference images not displayed]

FINDINGS: Lungs are hyperexpanded. No edema or consolidation. Heart size and
pulmonary vascularity are normal. No adenopathy. Gastrostomy
catheter positioned in left upper quadrant.
IMPRESSION: Lungs hyperexpanded. No edema or consolidation. Cardiac silhouette
within normal limits. No adenopathy evident.

## 2021-02-06 IMAGING — CT CT ABD-PELV W/ CM
2 of 6 series · 11 of 36 positions shown, 13 images · IV contrast (omnipaque)
Comparison: Recent chest and spine radiographs, no prior
cross-sectional imaging.

CLINICAL DATA: Unintended weight loss. Nonlocalized abdominal pain.
Technologist notes state COPD, progressive dysphagia, esophageal
diverticula and achalasia. G-tube placement 12/01/2018. Admitted for
acute on chronic back pain malnutrition and anemia.

EXAM:
CT CHEST, ABDOMEN, AND PELVIS WITH CONTRAST
TECHNIQUE: Multidetector CT imaging of the chest, abdomen and pelvis was
performed following the standard protocol during bolus
administration of intravenous contrast.
CONTRAST:  75mL OMNIPAQUE IOHEXOL 300 MG/ML  SOLN

[Series 508: thins · axial · 0.70mm/px · z∈[-610,-85]mm · 8 of 938 slices shown, 10 images]
[im 94/938  mediastinal]
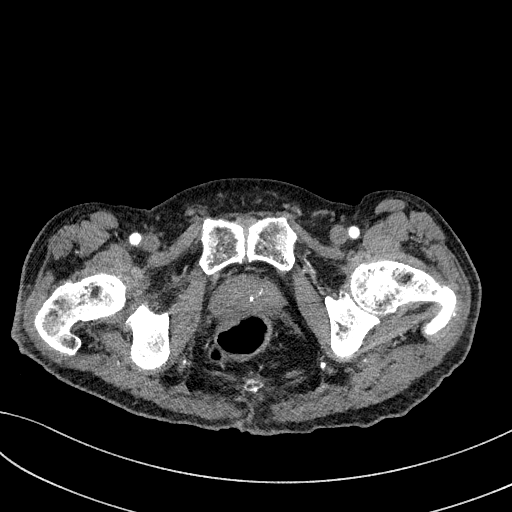
[im 94/938  lung]
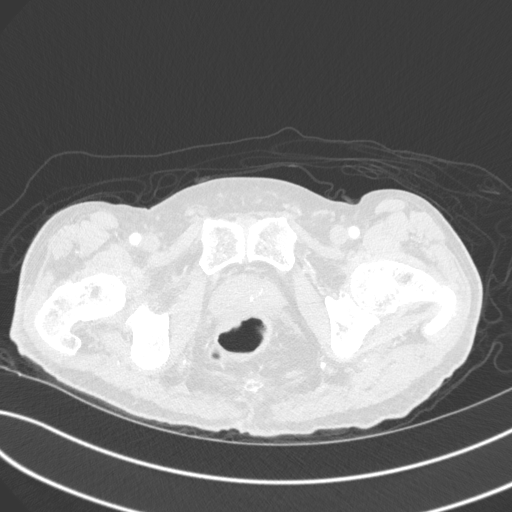
[im 188/938  lung]
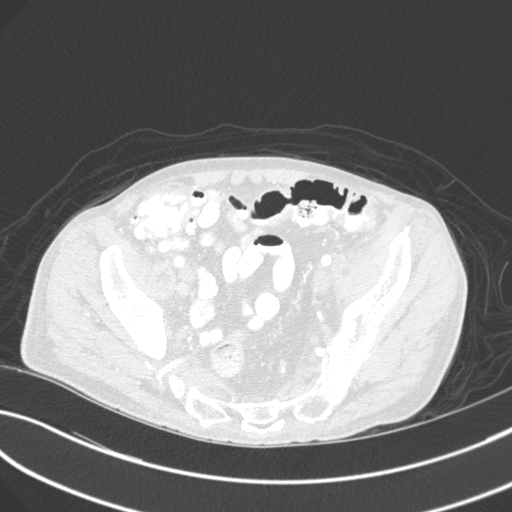
[im 282/938  lung]
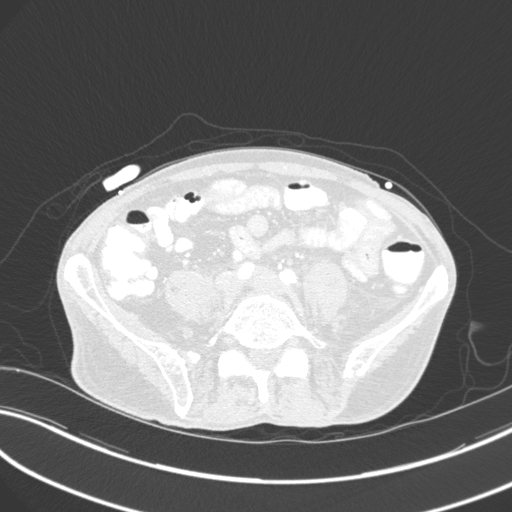
[im 422/938  lung]
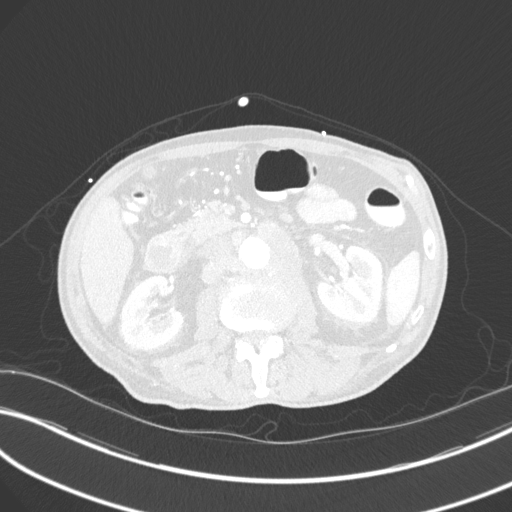
[im 516/938  mediastinal]
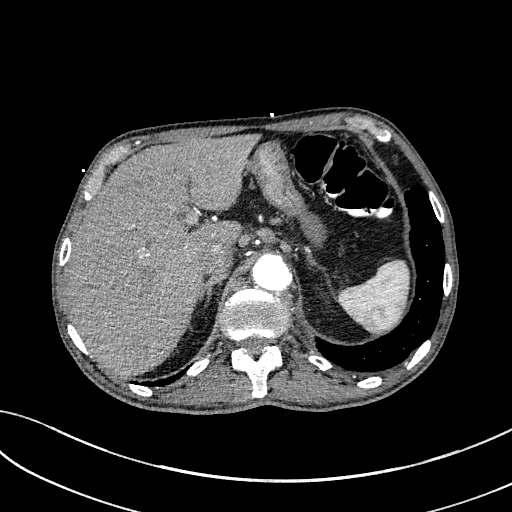
[im 516/938  lung]
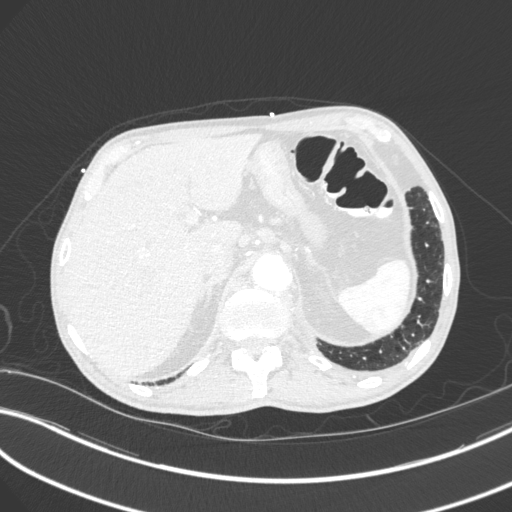
[im 656/938  lung]
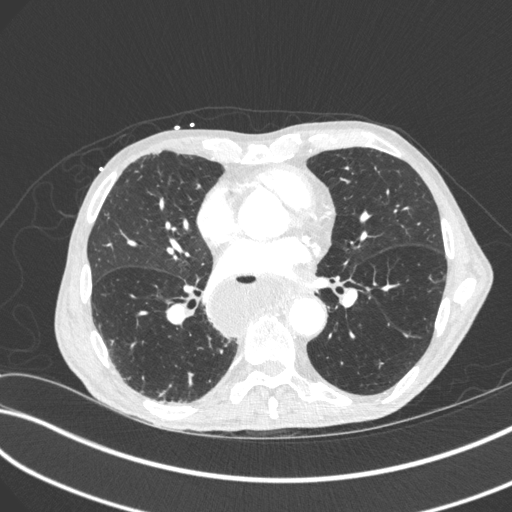
[im 750/938  lung]
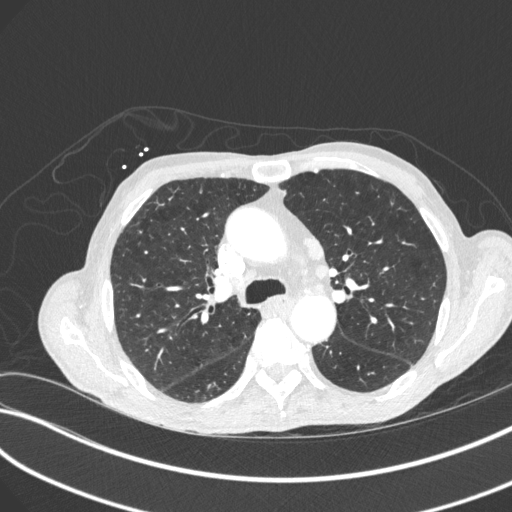
[im 844/938  lung]
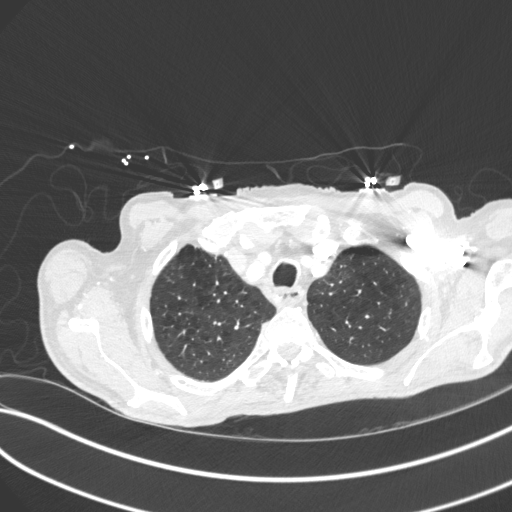

[Series 510: coronals · coronal · 0.70mm/px · 3 of 141 slices shown]
[im 29/141  lung]
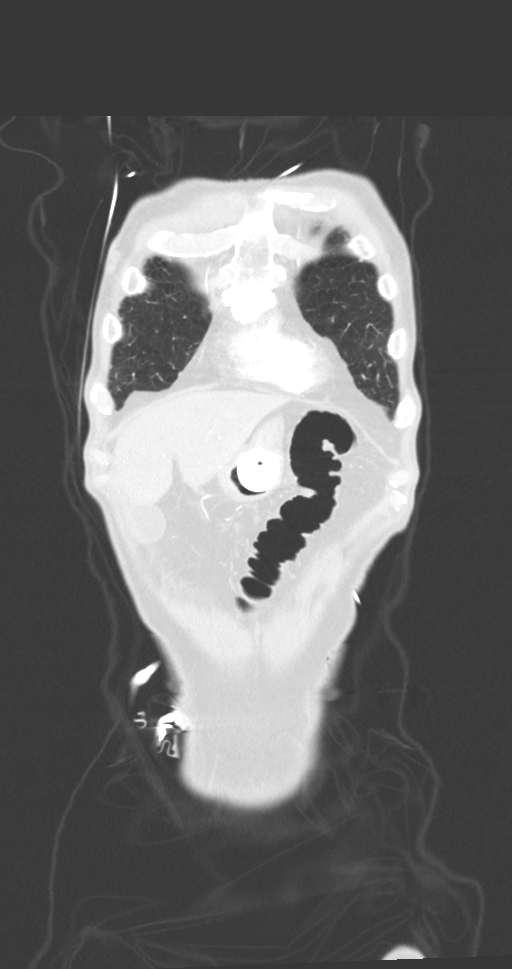
[im 57/141  lung]
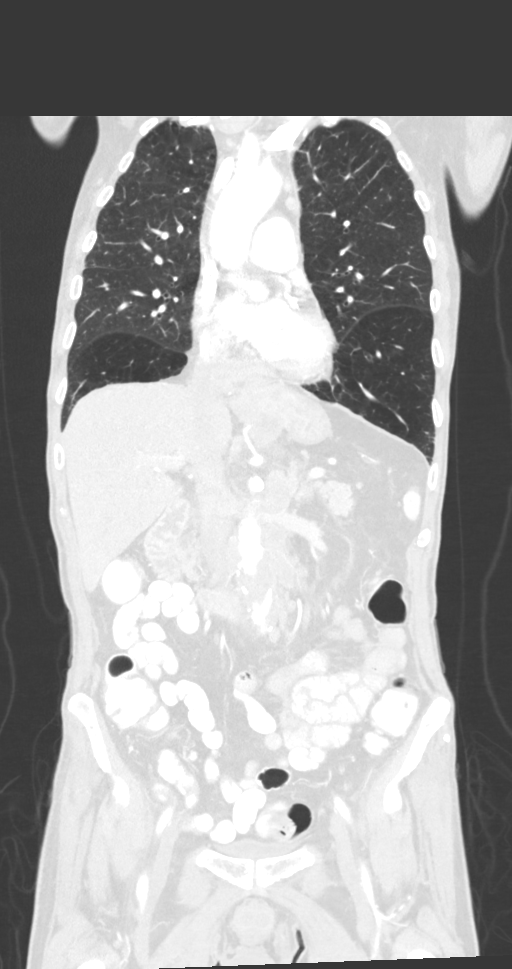
[im 85/141  lung]
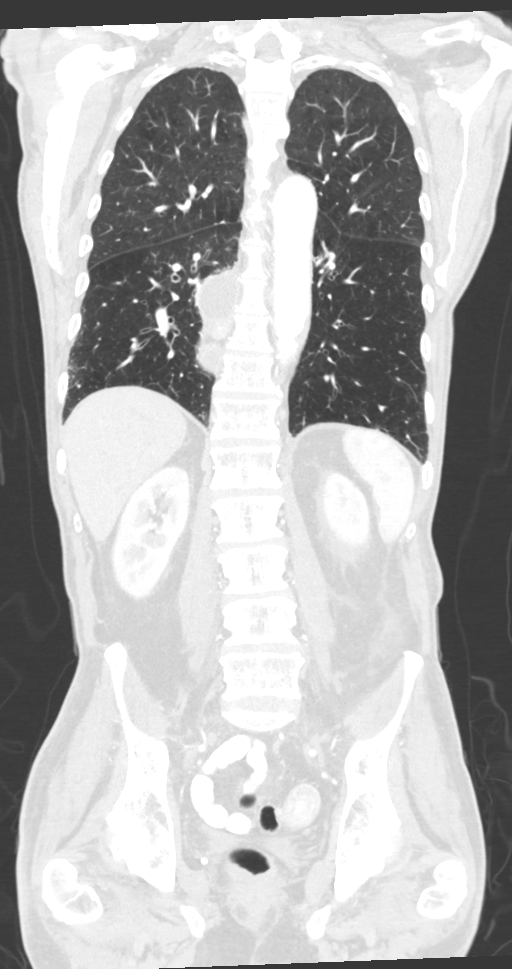

[11 of 36 positions shown; findings below may reference images not displayed]

FINDINGS: CT CHEST FINDINGS

Cardiovascular: Atherosclerosis and tortuosity of the thoracic
aorta. Irregular plaque in the distal descending thoracic aorta. No
dissection or acute aortic syndrome. Heart is normal in size. No
pericardial effusion. There are coronary artery calcifications. No
filling defects in the central most pulmonary arteries to the lobar
level to suggest pulmonary embolus.

Mediastinum/Nodes: Dilated fluid-filled esophagus consistent with
achalasia. Small focal outpouching from the distal esophagus, series
2, image 44, likely represents esophageal diverticulum. No
inflammatory change or perforation. There is irregular wall
thickening of the distal esophagus just proximal to the
gastroesophageal junction. Borderline prevascular nodes measuring up
to 10 mm. Small upper right paratracheal nodes, all subcentimeter.
No enlarged hilar lymph nodes. No visualized thyroid nodule.

Lungs/Pleura: Mild emphysema. Scattered areas of subpleural scarring
in both lower lobes. Patchy area of tree in bud opacities in the
superior segment of the right lower lobe, series 4, image 83. No
pulmonary edema or confluent airspace disease. No pleural fluid.
Trachea and mainstem bronchi are patent. No pulmonary mass.

Musculoskeletal: There are no acute or suspicious osseous
abnormalities. Scattered areas of calcification in the dorsal spinal
canal without frank canal impingement.

CT ABDOMEN PELVIS FINDINGS

Hepatobiliary: There are 2 enhancing lesions in the left lobe of the
liver, 17 and 16 mm both series 2, image 55. Punctate hepatic
granuloma in the right lobe. Gallbladder physiologically distended,
no calcified stone. Common bile duct is dilated at 10 mm. No
visualized choledocholithiasis.

Pancreas: Mildly dilated proximal pancreatic duct at 4 mm. No
obvious focal pancreatic mass. No peripancreatic inflammation.

Spleen: Normal in size. Splenule anteriorly. No evidence of focal
lesion, allowing for arterial phase imaging.

Adrenals/Urinary Tract: Normal right adrenal gland. Left adrenal
thickening without dominant nodule.

Hydronephrosis. There is bilateral perinephric edema. Homogeneous
enhancement with symmetric excretion on delayed phase imaging. No
evidence of focal renal mass. Urinary bladder is partially
distended. No obvious bladder wall thickening.

Stomach/Bowel: Gastrostomy tube with balloon appropriately
positioned in the stomach. Low density abutting the gastric lesser
curvature is felt to represent necrotic node rather than gastric
diverticulum. No bowel obstruction. Administered enteric contrast
reaches the colon. No bowel wall thickening or inflammatory change.
No evidence of colonic mass. Normal appendix.

Vascular/Lymphatic: Abnormal bulky retroperitoneal adenopathy
extending from the level of the renal vasculature to the iliac
bifurcation. Some of these nodes demonstrate central low density
consistent with necrosis. Representative nodal conglomerate in the
left periaortic station at the level just below the renal veins
measures 4.2 x 2.3 cm, series 2, image 74. Round soft tissue density
with central low-density the upper abdomen abutting the gastric
cardia/lesser curvature felt to represent a necrotic node measuring
2.0 x 1.9 cm, series 2, image 57. There are additional enlarged
nodes in the porta hepatis and gastrohepatic ligament. No definite
pelvic adenopathy. Advanced aortic atherosclerosis with irregular
plaque in the mid aorta causing approximately 50% luminal narrowing.
Limited assessment for portal vein and IVC patency given phase of
contrast, however on delayed phase imaging, no obvious venous
thrombus is seen.

Reproductive: Prominent prostate gland spans 5.3 cm. Central
prostatic calcifications. No obvious prostatic mass.

Other: Bilateral retroperitoneal stranding, left greater than right.
No significant ascites. No definite omental nodularity. No free air.

Musculoskeletal: There are no acute or suspicious osseous
abnormalities. Degenerative change in the lumbar spine.
IMPRESSION: 1. Bulky retroperitoneal adenopathy, some of which appears necrotic
with central low density. There also 2 enhancing lesions in the left
lobe of the liver, in this setting are suspicious for metastatic
disease. Primary malignancy not definitively characterize. Patient
has achalasia, with irregular wall thickening of the distal
esophagus, which could be a source of primary malignancy. Consider
further evaluation with endoscopy. Recommend oncologic referral.
2. Borderline prevascular nodes at 10 mm, nonspecific.
3. Common bile duct dilatation at 10 mm with prominence of the
pancreatic duct of 4 mm. No obvious pancreatic mass or cause of
obstruction. This could be further evaluated with MRCP, if patient
is able to tolerate breath hold technique.
4. Thoracoabdominal aortic atherosclerosis with irregular plaque of
the distal descending thoracic and infrarenal abdominal aorta.
5. Emphysema.
6. Mild patchy tree in bud opacities in the right lower lobe, likely
infectious or inflammatory.
7. Bilateral perinephric edema which is nonspecific, and may be
chronic.

Aortic Atherosclerosis (5M8MU-KDP.P) and Emphysema ([id number].8).

## 2021-02-07 IMAGING — US US BIOPSY CORE LIVER
1 series · 11 of 11 positions shown · non-contrast
Comparison: CT of the chest, abdomen and pelvis-03/06/2019

INDICATION: No known primary, now with thickening of the distal esophagus and
associated adenopathy and ill-defined lesions within the left lobe
of the liver worrisome for metastatic disease. As such, request made
for ultrasound-guided liver lesion biopsy for tissue diagnostic
purposes.

EXAM:
ULTRASOUND GUIDED LIVER LESION BIOPSY

[Series 1: us biopsy core liver · 0.15mm/px · 11 of 11 slices shown]
[im 1/11]
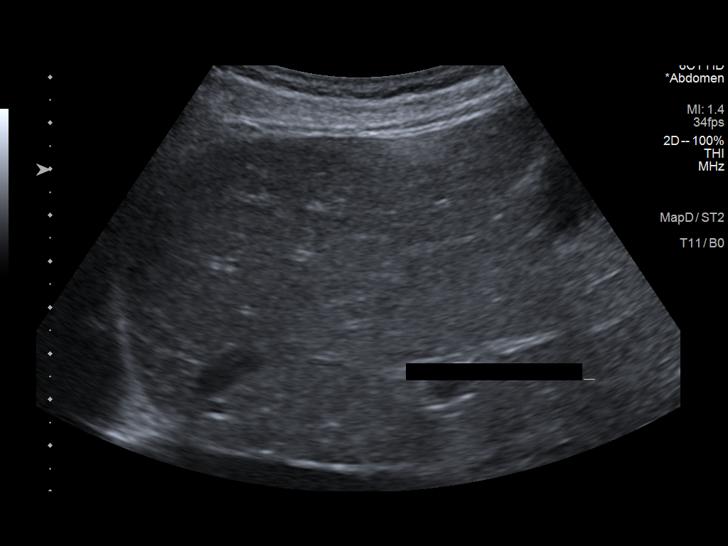
[im 2/11]
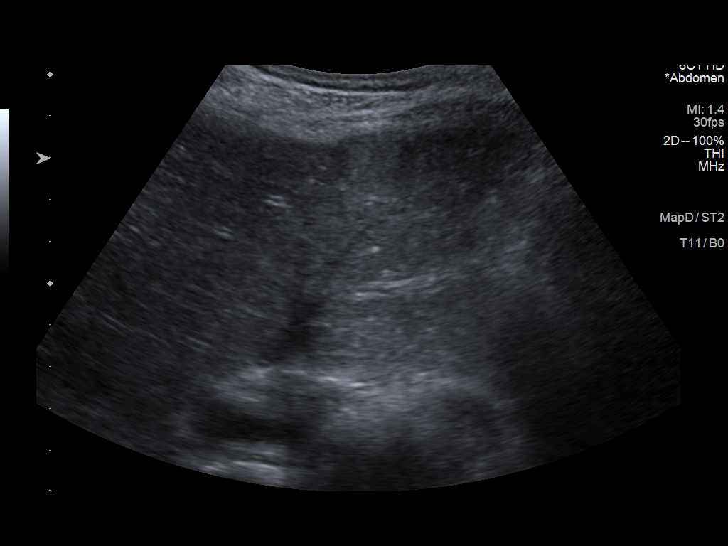
[im 3/11]
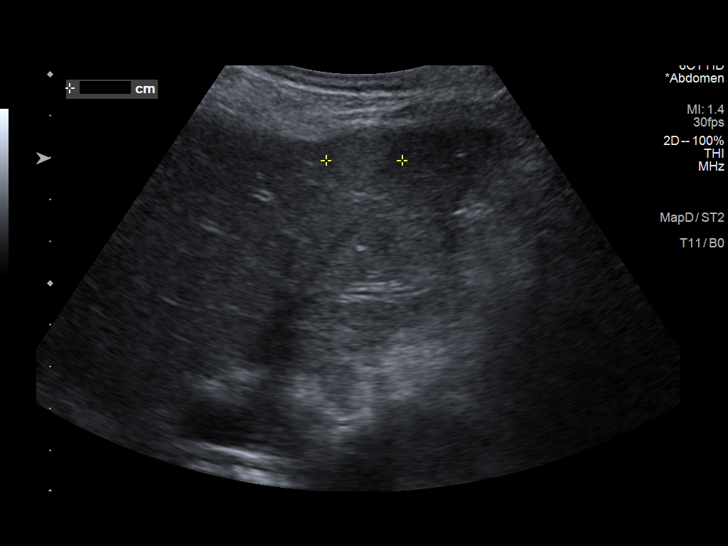
[im 4/11]
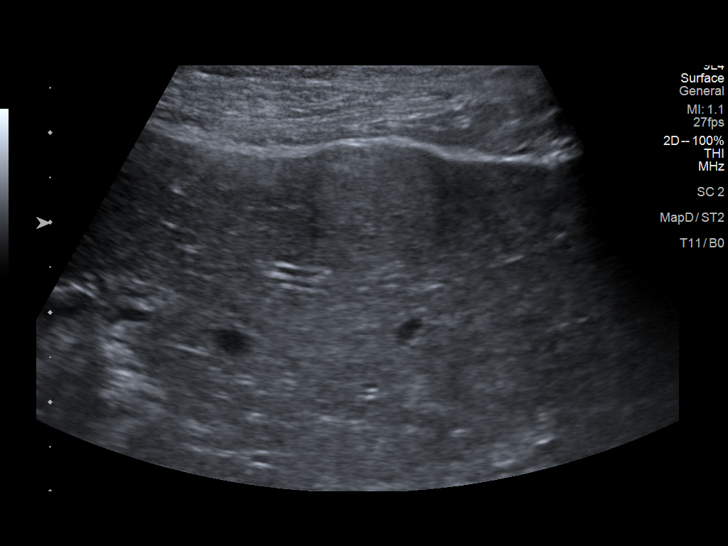
[im 5/11]
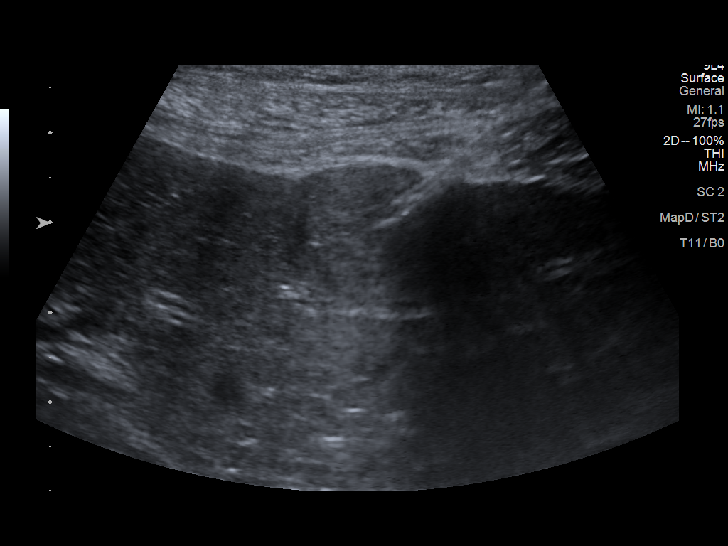
[im 6/11]
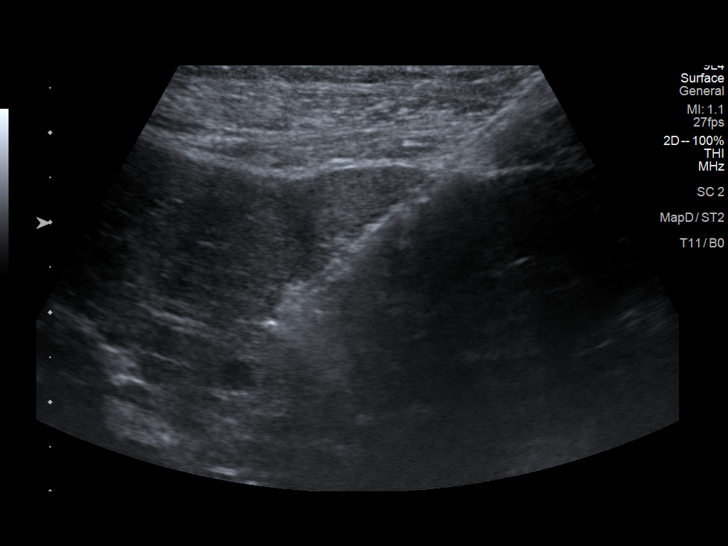
[im 7/11]
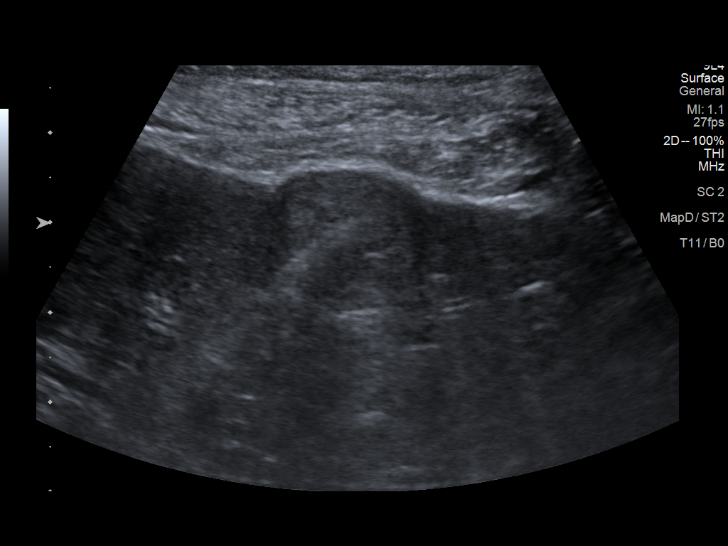
[im 8/11]
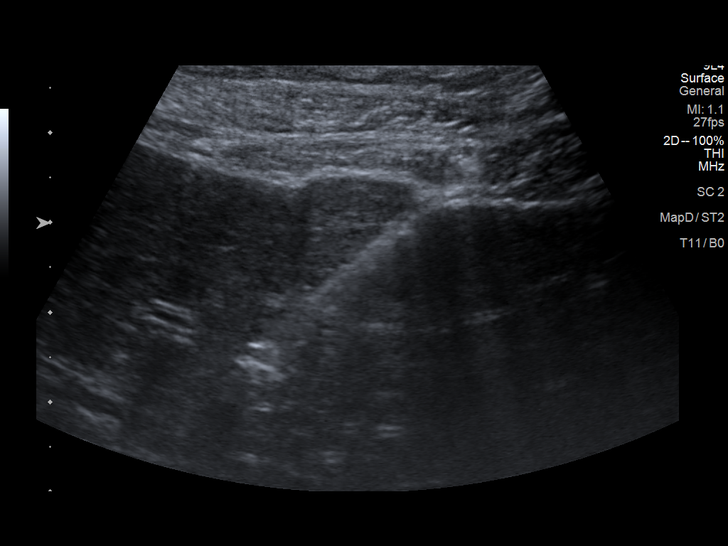
[im 9/11]
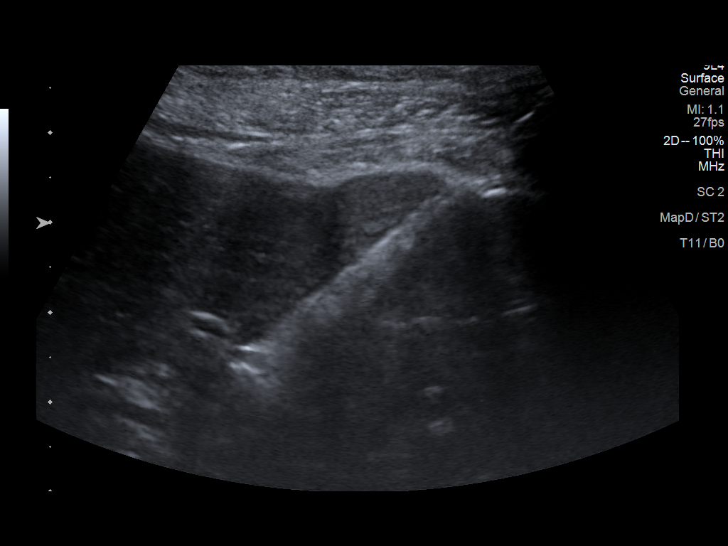
[im 10/11]
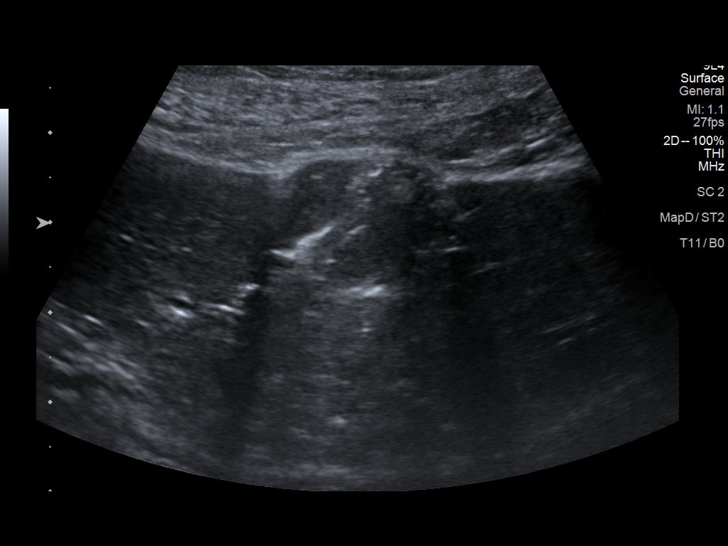
[im 11/11]
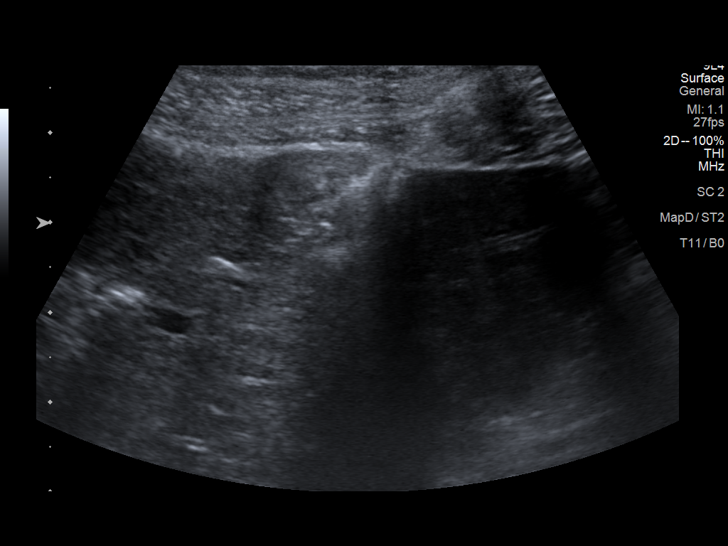

[11 of 11 positions shown; findings below may reference images not displayed]

MEDICATIONS:
None

ANESTHESIA/SEDATION:
Fentanyl 50 mcg IV; Versed 1 mg IV

Total Moderate Sedation time:  10 Minutes.

The patient's level of consciousness and vital signs were monitored
continuously by radiology nursing throughout the procedure under my
direct supervision.

COMPLICATIONS:
None immediate.

PROCEDURE:
Informed written consent was obtained from the patient after a
discussion of the risks, benefits and alternatives to treatment. The
patient understands and consents the procedure. A timeout was
performed prior to the initiation of the procedure.

Ultrasound scanning was performed of the right upper abdominal
quadrant demonstrates an approximately 1.5 x 1.5 lesion within the
subcapsular aspect the left lobe of the liver correlating with the
lesion seen on preceding abdominal CT image 55, series 2). The
procedure was planned.

The midline of the abdomen was prepped and draped in the usual
sterile fashion. The overlying soft tissues were anesthetized with
1% lidocaine with epinephrine. A 17 gauge, 6.8 cm co-axial needle
was advanced into a peripheral aspect of the lesion. This was
followed by 4 core biopsies with an 18 gauge core device under
direct ultrasound guidance. Multiple ultrasound images were saved
for procedural documentation purposes.

The coaxial needle tract was embolized with a small amount of
Gel-Foam slurry and superficial hemostasis was obtained with manual
compression. Post procedural scanning was negative for definitive
area of hemorrhage or additional complication. A dressing was
placed. The patient tolerated the procedure well without immediate
post procedural complication.
IMPRESSION: Technically successful ultrasound guided core needle biopsy of
indeterminate lesion within the subcapsular aspect of the left lobe
of the liver.
